# Patient Record
Sex: Female | Born: 1950 | Race: White | Hispanic: No | Marital: Married | Smoking: Never smoker
Health system: Southern US, Community
[De-identification: ages and names within clinical notes are randomized; demographics above are authoritative.]

## PROBLEM LIST (undated history)

## (undated) DIAGNOSIS — L719 Rosacea, unspecified: Secondary | ICD-10-CM

## (undated) DIAGNOSIS — G608 Other hereditary and idiopathic neuropathies: Secondary | ICD-10-CM

## (undated) DIAGNOSIS — K219 Gastro-esophageal reflux disease without esophagitis: Secondary | ICD-10-CM

## (undated) DIAGNOSIS — R112 Nausea with vomiting, unspecified: Secondary | ICD-10-CM

## (undated) DIAGNOSIS — C189 Malignant neoplasm of colon, unspecified: Secondary | ICD-10-CM

## (undated) DIAGNOSIS — Z860100 Personal history of colon polyps, unspecified: Secondary | ICD-10-CM

## (undated) DIAGNOSIS — G709 Myoneural disorder, unspecified: Secondary | ICD-10-CM

## (undated) DIAGNOSIS — Z9889 Other specified postprocedural states: Secondary | ICD-10-CM

## (undated) DIAGNOSIS — M8580 Other specified disorders of bone density and structure, unspecified site: Secondary | ICD-10-CM

## (undated) DIAGNOSIS — I639 Cerebral infarction, unspecified: Secondary | ICD-10-CM

## (undated) DIAGNOSIS — Z8719 Personal history of other diseases of the digestive system: Secondary | ICD-10-CM

## (undated) DIAGNOSIS — M1990 Unspecified osteoarthritis, unspecified site: Secondary | ICD-10-CM

## (undated) DIAGNOSIS — C801 Malignant (primary) neoplasm, unspecified: Secondary | ICD-10-CM

## (undated) DIAGNOSIS — K529 Noninfective gastroenteritis and colitis, unspecified: Secondary | ICD-10-CM

## (undated) DIAGNOSIS — M8589 Other specified disorders of bone density and structure, multiple sites: Secondary | ICD-10-CM

## (undated) DIAGNOSIS — E785 Hyperlipidemia, unspecified: Secondary | ICD-10-CM

## (undated) DIAGNOSIS — R202 Paresthesia of skin: Secondary | ICD-10-CM

## (undated) DIAGNOSIS — R200 Anesthesia of skin: Secondary | ICD-10-CM

## (undated) DIAGNOSIS — I10 Essential (primary) hypertension: Secondary | ICD-10-CM

## (undated) HISTORY — PX: CHOLECYSTECTOMY: SHX55

## (undated) HISTORY — PX: EYE SURGERY: SHX253

## (undated) HISTORY — PX: ABDOMINAL HYSTERECTOMY: SHX81

## (undated) HISTORY — PX: TONSILLECTOMY: SUR1361

## (undated) HISTORY — PX: COLON SURGERY: SHX602

---

## 2010-12-04 HISTORY — PX: COLON SURGERY: SHX602

## 2017-02-26 ENCOUNTER — Other Ambulatory Visit: Payer: Self-pay | Admitting: Family Medicine

## 2017-02-26 DIAGNOSIS — Z1231 Encounter for screening mammogram for malignant neoplasm of breast: Secondary | ICD-10-CM

## 2017-03-26 ENCOUNTER — Ambulatory Visit
Admission: RE | Admit: 2017-03-26 | Discharge: 2017-03-26 | Disposition: A | Discharge status: Home / Self Care | Payer: Medicare HMO | Source: Ambulatory Visit | Attending: Family Medicine | Admitting: Family Medicine

## 2017-03-26 ENCOUNTER — Encounter: Payer: Self-pay | Admitting: Radiology

## 2017-03-26 DIAGNOSIS — Z1231 Encounter for screening mammogram for malignant neoplasm of breast: Secondary | ICD-10-CM | POA: Diagnosis not present

## 2017-10-29 ENCOUNTER — Encounter: Payer: Self-pay | Admitting: *Deleted

## 2017-10-30 ENCOUNTER — Encounter
Admission: RE | Disposition: A | Discharge status: Home / Self Care | Payer: Self-pay | Source: Ambulatory Visit | Attending: Gastroenterology

## 2017-10-30 ENCOUNTER — Ambulatory Visit: Payer: Medicare HMO | Admitting: Anesthesiology

## 2017-10-30 ENCOUNTER — Encounter: Payer: Self-pay | Admitting: Anesthesiology

## 2017-10-30 ENCOUNTER — Ambulatory Visit
Admission: RE | Admit: 2017-10-30 | Discharge: 2017-10-30 | Disposition: A | Discharge status: Home / Self Care | Payer: Medicare HMO | Source: Ambulatory Visit | Attending: Gastroenterology | Admitting: Gastroenterology

## 2017-10-30 DIAGNOSIS — Z85038 Personal history of other malignant neoplasm of large intestine: Secondary | ICD-10-CM | POA: Diagnosis present

## 2017-10-30 DIAGNOSIS — E119 Type 2 diabetes mellitus without complications: Secondary | ICD-10-CM | POA: Insufficient documentation

## 2017-10-30 DIAGNOSIS — L719 Rosacea, unspecified: Secondary | ICD-10-CM | POA: Insufficient documentation

## 2017-10-30 DIAGNOSIS — K2960 Other gastritis without bleeding: Secondary | ICD-10-CM | POA: Insufficient documentation

## 2017-10-30 DIAGNOSIS — Z8673 Personal history of transient ischemic attack (TIA), and cerebral infarction without residual deficits: Secondary | ICD-10-CM | POA: Diagnosis not present

## 2017-10-30 DIAGNOSIS — Z980 Intestinal bypass and anastomosis status: Secondary | ICD-10-CM | POA: Insufficient documentation

## 2017-10-30 DIAGNOSIS — K449 Diaphragmatic hernia without obstruction or gangrene: Secondary | ICD-10-CM | POA: Insufficient documentation

## 2017-10-30 DIAGNOSIS — I10 Essential (primary) hypertension: Secondary | ICD-10-CM | POA: Insufficient documentation

## 2017-10-30 DIAGNOSIS — K210 Gastro-esophageal reflux disease with esophagitis: Secondary | ICD-10-CM | POA: Diagnosis not present

## 2017-10-30 DIAGNOSIS — Z86010 Personal history of colonic polyps: Secondary | ICD-10-CM | POA: Diagnosis not present

## 2017-10-30 DIAGNOSIS — Z79899 Other long term (current) drug therapy: Secondary | ICD-10-CM | POA: Insufficient documentation

## 2017-10-30 DIAGNOSIS — K52832 Lymphocytic colitis: Secondary | ICD-10-CM | POA: Diagnosis not present

## 2017-10-30 DIAGNOSIS — M8580 Other specified disorders of bone density and structure, unspecified site: Secondary | ICD-10-CM | POA: Diagnosis not present

## 2017-10-30 DIAGNOSIS — E785 Hyperlipidemia, unspecified: Secondary | ICD-10-CM | POA: Diagnosis not present

## 2017-10-30 DIAGNOSIS — K5730 Diverticulosis of large intestine without perforation or abscess without bleeding: Secondary | ICD-10-CM | POA: Insufficient documentation

## 2017-10-30 DIAGNOSIS — K317 Polyp of stomach and duodenum: Secondary | ICD-10-CM | POA: Diagnosis not present

## 2017-10-30 HISTORY — DX: Malignant (primary) neoplasm, unspecified: C80.1

## 2017-10-30 HISTORY — DX: Other specified disorders of bone density and structure, unspecified site: M85.80

## 2017-10-30 HISTORY — DX: Rosacea, unspecified: L71.9

## 2017-10-30 HISTORY — DX: Cerebral infarction, unspecified: I63.9

## 2017-10-30 HISTORY — DX: Hyperlipidemia, unspecified: E78.5

## 2017-10-30 HISTORY — DX: Essential (primary) hypertension: I10

## 2017-10-30 HISTORY — PX: COLONOSCOPY WITH PROPOFOL: SHX5780

## 2017-10-30 HISTORY — PX: ESOPHAGOGASTRODUODENOSCOPY (EGD) WITH PROPOFOL: SHX5813

## 2017-10-30 HISTORY — DX: Gastro-esophageal reflux disease without esophagitis: K21.9

## 2017-10-30 SURGERY — ESOPHAGOGASTRODUODENOSCOPY (EGD) WITH PROPOFOL
Anesthesia: General

## 2017-10-30 MED ORDER — MIDAZOLAM HCL 2 MG/2ML IJ SOLN
INTRAMUSCULAR | Status: DC | PRN
  Administered 2017-10-30: 2 mg via INTRAVENOUS

## 2017-10-30 MED ORDER — EPHEDRINE SULFATE 50 MG/ML IJ SOLN
INTRAMUSCULAR | Status: DC | PRN
  Administered 2017-10-30: 10 mg via INTRAVENOUS

## 2017-10-30 MED ORDER — FENTANYL CITRATE (PF) 100 MCG/2ML IJ SOLN
INTRAMUSCULAR | Status: AC
  Filled 2017-10-30: qty 2

## 2017-10-30 MED ORDER — FENTANYL CITRATE (PF) 100 MCG/2ML IJ SOLN
INTRAMUSCULAR | Status: DC | PRN
  Administered 2017-10-30: 50 ug via INTRAVENOUS

## 2017-10-30 MED ORDER — PROPOFOL 500 MG/50ML IV EMUL
INTRAVENOUS | Status: AC
  Filled 2017-10-30: qty 50

## 2017-10-30 MED ORDER — SODIUM CHLORIDE 0.9 % IV SOLN
INTRAVENOUS | Status: DC
  Administered 2017-10-30: 1000 mL via INTRAVENOUS

## 2017-10-30 MED ORDER — LIDOCAINE HCL (PF) 1 % IJ SOLN
2.0000 mL | Freq: Once | INTRAMUSCULAR | Status: AC
  Administered 2017-10-30: 0.3 mL via INTRADERMAL

## 2017-10-30 MED ORDER — LIDOCAINE HCL (PF) 1 % IJ SOLN
INTRAMUSCULAR | Status: AC
  Administered 2017-10-30: 0.3 mL via INTRADERMAL
  Filled 2017-10-30: qty 2

## 2017-10-30 MED ORDER — PHENYLEPHRINE HCL 10 MG/ML IJ SOLN
INTRAMUSCULAR | Status: DC | PRN
  Administered 2017-10-30 (×4): 100 ug via INTRAVENOUS
  Administered 2017-10-30: 50 ug via INTRAVENOUS
  Administered 2017-10-30: 100 ug via INTRAVENOUS
  Administered 2017-10-30: 50 ug via INTRAVENOUS

## 2017-10-30 MED ORDER — EPHEDRINE SULFATE 50 MG/ML IJ SOLN
INTRAMUSCULAR | Status: AC
Start: 2017-10-30 — End: ?
  Filled 2017-10-30: qty 1

## 2017-10-30 MED ORDER — LIDOCAINE HCL (PF) 2 % IJ SOLN
INTRAMUSCULAR | Status: AC
  Filled 2017-10-30: qty 10

## 2017-10-30 MED ORDER — SODIUM CHLORIDE 0.9 % IV SOLN
INTRAVENOUS | Status: DC
  Administered 2017-10-30: 13:00:00 via INTRAVENOUS

## 2017-10-30 MED ORDER — LIDOCAINE HCL (CARDIAC) 20 MG/ML IV SOLN
INTRAVENOUS | Status: DC | PRN
  Administered 2017-10-30: 50 mg via INTRAVENOUS

## 2017-10-30 MED ORDER — PROPOFOL 500 MG/50ML IV EMUL
INTRAVENOUS | Status: DC | PRN
  Administered 2017-10-30: 120 ug/kg/min via INTRAVENOUS

## 2017-10-30 MED ORDER — MIDAZOLAM HCL 2 MG/2ML IJ SOLN
INTRAMUSCULAR | Status: AC
  Filled 2017-10-30: qty 2

## 2017-10-30 NOTE — Op Note (Signed)
[facility] Gastroenterology Patient Name: Brittany Franklin Procedure Date: 10/30/2017 1:01 PM MRN: [id number] Account #: [id number] Date of Birth: 1951/07/14 Admit Type: Outpatient Age: 66 Room: [facility] ENDO ROOM 3 Gender: Female Note Status: Finalized Procedure:            Colonoscopy Indications:          Personal history of malignant neoplasm of the colon,                        Personal history of colonic polyps Providers:            Lollie Sails, MD Referring MD:         Dion Body (Referring MD) Medicines:            Monitored Anesthesia Care Complications:        No immediate complications. Procedure:            Pre-Anesthesia Assessment:                       - ASA Grade Assessment: II - A patient with mild                        systemic disease.                       After obtaining informed consent, the colonoscope was                        passed under direct vision. Throughout the procedure,                        the patient's blood pressure, pulse, and oxygen                        saturations were monitored continuously. The                        Colonoscope was introduced through the anus and                        advanced to the the ileocolonic anastomosis. The                        colonoscopy was performed with moderate difficulty due                        to restricted mobility of the colon, significant                        looping and a tortuous colon. Successful completion of                        the procedure was aided by changing the patient to a                        supine position, changing the patient to a prone                        position and using manual pressure. The patient  tolerated the procedure well. The quality of the bowel                        preparation was good. Findings:      There was evidence of a prior end-to-side ileo-colonic anastomosis in       the mid ascending  colon. This was patent and was characterized by       healthy appearing mucosa, but with a small polypoid lesion at the       anastomosis that was friable to touch. This was removed completely with       a cold forcep.      The sigmoid colon, descending colon and transverse colon were       significantly redundant.      Multiple medium-mouthed diverticula were found in the sigmoid colon and       descending colon.      The digital rectal exam was normal.      The retroflexed view of the distal rectum and anal verge was normal and       showed no anal or rectal abnormalities.      Biopsies for histology were taken with a cold forceps from the right       colon and left colon for evaluation of microscopic colitis. Impression:           - Patent end-to-side ileo-colonic anastomosis,                        characterized by healthy appearing mucosa. One small                        polypoid lesion at the anastomosis, removed.                       - Redundant colon.                       - Diverticulosis in the sigmoid colon and in the                        descending colon.                       - The distal rectum and anal verge are normal on                        retroflexion view. Recommendation:       - Discharge patient to home.                       - Await pathology results.                       - Telephone GI clinic for pathology results in 1 week. Procedure Code(s):    --- Professional ---                       [phone], Colonoscopy, flexible; with biopsy, single or                        multiple Diagnosis Code(s):    --- Professional ---  Z98.0, Intestinal bypass and anastomosis status                       Z85.038, Personal history of other malignant neoplasm                        of large intestine                       Z86.010, Personal history of colonic polyps                       K57.30, Diverticulosis of large intestine without                         perforation or abscess without bleeding                       Q43.8, Other specified congenital malformations of                        intestine CPT copyright 2016 American Medical Association. All rights reserved. The codes documented in this report are preliminary and upon coder review may  be revised to meet current compliance requirements. Lollie Sails, MD 10/30/2017 2:36:33 PM This report has been signed electronically. Number of Addenda: 0 Note Initiated On: 10/30/2017 1:01 PM Scope Withdrawal Time: 0 hours 14 minutes 45 seconds  Total Procedure Duration: 0 hours 38 minutes 42 seconds       [facility]

## 2017-10-30 NOTE — Op Note (Signed)
[facility] Gastroenterology Patient Name: Brittany Franklin Procedure Date: 10/30/2017 1:01 PM MRN: [id number] Account #: [id number] Date of Birth: 06-20-1951 Admit Type: Outpatient Age: 66 Room: [facility] ENDO ROOM 3 Gender: Female Note Status: Finalized Procedure:            Upper GI endoscopy Indications:          Gastro-esophageal reflux disease Providers:            Lollie Sails, MD Referring MD:         Dion Body (Referring MD) Medicines:            Monitored Anesthesia Care Complications:        No immediate complications. Procedure:            Pre-Anesthesia Assessment:                       - ASA Grade Assessment: II - A patient with mild                        systemic disease.                       After obtaining informed consent, the endoscope was                        passed under direct vision. Throughout the procedure,                        the patient's blood pressure, pulse, and oxygen                        saturations were monitored continuously. The Endoscope                        was introduced through the mouth, and advanced to the                        duodenal bulb. The upper GI endoscopy was accomplished                        without difficulty. The patient tolerated the procedure                        well. Findings:      LA Grade A (one or more mucosal breaks less than 5 mm, not extending       between tops of 2 mucosal folds) esophagitis with no bleeding was found.       Biopsies were taken with a cold forceps for histology.      A small hiatal hernia was present.      The exam of the esophagus was otherwise normal.      Diffuse and patchy mild inflammation characterized by adherent blood,       congestion (edema), erythema and granularity was found in the gastric       body and in the gastric antrum. Biopsies were taken with a cold forceps       for histology. Biopsies were taken with a cold forceps for Helicobacter        pylori testing.      The cardia and gastric fundus were normal on retroflexion otherwise.  The duodenal bulb was normal. Despite several efforts I was unable to       pass into the second portion of the duodenum due to a sharp angulation.      A single 5 mm sessile polyp with no bleeding and no stigmata of recent       bleeding was found on the greater curvature of the gastric body. This       was atypical in appearance with a small non-bleeding erosion in the       center. Biopsies were taken and placed into a separate jar with a cold       forceps for histology.      Multiple 1 to 3 mm sessile polyps with no bleeding and no stigmata of       recent bleeding were found in the gastric body. Biopsies were taken with       a cold forceps for histology. Impression:           - LA Grade A reflux esophagitis. Biopsied.                       - Small hiatal hernia.                       - Erosive gastritis. Biopsied.                       - Normal duodenal bulb. Recommendation:       - Await pathology results.                       - Use Protonix (pantoprazole) 40 mg PO daily daily.                       - Await pathology results.                       - Return to GI clinic in 4 weeks. Procedure Code(s):    --- Professional ---                       [phone], Esophagogastroduodenoscopy, flexible, transoral;                        with biopsy, single or multiple Diagnosis Code(s):    --- Professional ---                       K21.0, Gastro-esophageal reflux disease with esophagitis                       K44.9, Diaphragmatic hernia without obstruction or                        gangrene                       K29.60, Other gastritis without bleeding CPT copyright 2016 American Medical Association. All rights reserved. The codes documented in this report are preliminary and upon coder review may  be revised to meet current compliance requirements. Lollie Sails, MD 10/30/2017 1:46:08  PM This report has been signed electronically. Number of Addenda: 0 Note Initiated On: 10/30/2017 1:01 PM      [facility]

## 2017-10-30 NOTE — Transfer of Care (Signed)
Immediate Anesthesia Transfer of Care Note  Patient: Brittany Franklin  Procedure(s) Performed: ESOPHAGOGASTRODUODENOSCOPY (EGD) WITH PROPOFOL (N/A ) COLONOSCOPY WITH PROPOFOL (N/A )  Patient Location: PACU  Anesthesia Type:General  Level of Consciousness: awake and sedated  Airway & Oxygen Therapy: Patient Spontanous Breathing and Patient connected to nasal cannula oxygen  Post-op Assessment: Report given to RN and Post -op Vital signs reviewed and stable  Post vital signs: Reviewed and stable  Last Vitals:  Vitals:   10/30/17 1231  BP: (!) 146/73  Pulse: 90  Resp: 20  Temp: (!) 35.7 C  SpO2: 100%    Last Pain:  Vitals:   10/30/17 1231  TempSrc: Tympanic         Complications: No apparent anesthesia complications

## 2017-10-30 NOTE — Anesthesia Procedure Notes (Signed)
Performed by: Cook-Martin, Jozi Malachi Pre-anesthesia Checklist: Patient identified, Emergency Drugs available, Suction available, Patient being monitored and Timeout performed Patient Re-evaluated:Patient Re-evaluated prior to induction Oxygen Delivery Method: Nasal cannula Preoxygenation: Pre-oxygenation with 100% oxygen Induction Type: IV induction Airway Equipment and Method: Bite block Placement Confirmation: positive ETCO2 and CO2 detector       

## 2017-10-30 NOTE — H&P (Signed)
Outpatient short stay form Pre-procedure 10/30/2017 12:26 PM Brittany Sails MD  Primary Physician: Dr Dion Body  Reason for visit:  EGD and colonoscopy  History of present illness:  Patient is a 66 year old female presenting today as above. 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 She subsequently underwent a sigmoid colectomy. There were negativeand she required no adjuvant treatment. She also has a history of GERD and uses antiacids at least 3 times a week. She is currently not on a proton pump inhibitor. She tolerated her prep well. She takes no aspirin or blood thinning agents.    Current Facility-Administered Medications:  .  0.9 %  sodium chloride infusion, , Intravenous, Continuous, Brittany Sails, MD  Medications Prior to Admission  Medication Sig Dispense Refill Last Dose  . amLODipine (NORVASC) 5 MG tablet Take 5 mg by mouth daily.     . Azelaic Acid (FINACEA) 15 % cream Apply topically daily. After skin is thoroughly washed and patted dry, gently but thoroughly massage a thin film of azelaic acid cream into the affected area twice daily, in the morning and evening.     . cetirizine (ZYRTEC) 10 MG tablet Take 10 mg by mouth daily.     Marland Kitchen co-enzyme Q-10 50 MG capsule Take 200 mg by mouth daily.     Marland Kitchen esomeprazole (NEXIUM) 20 MG capsule Take 20 mg by mouth daily at 12 noon.     . metroNIDAZOLE (METROCREAM) 0.75 % cream Apply topically 2 (two) times daily.     . Multiple Vitamin (MULTIVITAMIN) tablet Take 1 tablet by mouth daily.     . potassium bicarbonate (K-LYTE) 25 MEQ disintegrating tablet Take 25 mEq by mouth 2 (two) times daily.     . Red Yeast Rice Extract (RED YEAST RICE PO) Take by mouth daily.        Not on File   Past Medical History:  Diagnosis Date  . Cancer (HCC)    COLON CANCER  . Diabetes mellitus without complication ([place])   . GERD (gastroesophageal reflux disease)   .  Hyperlipidemia   . Hypertension   . Osteopenia   . Rosacea   . Stroke [facility])     Review of systems:      Physical Exam    Heart and lungs: Regular rate and rhythm without rub or gallop, lungs are bilaterally clear.    HEENT: Normocephalic atraumatic eyes are anicteric    Other:     Pertinant exam for procedure: Soft nontender nondistended bowel sounds positive normoactive.    Planned proceedures: EGD, colonoscopy and indicated procedures. I have discussed the risks benefits and complications of procedures to include not limited to bleeding, infection, perforation and the risk of sedation and the patient wishes to proceed.    Brittany Sails, MD Gastroenterology 10/30/2017  12:26 PM

## 2017-10-30 NOTE — Anesthesia Preprocedure Evaluation (Signed)
Anesthesia Evaluation  Patient identified by MRN, date of birth, ID band  Airway Mallampati: II       Dental  (+) Teeth Intact   Pulmonary neg pulmonary ROS,    breath sounds clear to auscultation       Cardiovascular Exercise Tolerance: Good hypertension, Pt. on medications  Rhythm:Regular Rate:Normal     Neuro/Psych CVA negative neurological ROS     GI/Hepatic Neg liver ROS, GERD  Medicated,  Endo/Other  diabetes  Renal/GU negative Renal ROS     Musculoskeletal   Abdominal Normal abdominal exam  (+)   Peds negative pediatric ROS (+)  Hematology negative hematology ROS (+)   Anesthesia Other Findings   Reproductive/Obstetrics                             Anesthesia Physical Anesthesia Plan  ASA: II  Anesthesia Plan: General   Post-op Pain Management:    Induction: Intravenous  PONV Risk Score and Plan: 1 and Ondansetron  Airway Management Planned: Natural Airway and Nasal Cannula  Additional Equipment:   Intra-op Plan:   Post-operative Plan:   Informed Consent: I have reviewed the patients History and Physical, chart, labs and discussed the procedure including the risks, benefits and alternatives for the proposed anesthesia with the patient or authorized representative who has indicated his/her understanding and acceptance.     Plan Discussed with: CRNA  Anesthesia Plan Comments:         Anesthesia Quick Evaluation

## 2017-10-30 NOTE — Anesthesia Post-op Follow-up Note (Signed)
Anesthesia QCDR form completed.        

## 2017-10-31 ENCOUNTER — Encounter: Payer: Self-pay | Admitting: Gastroenterology

## 2017-10-31 NOTE — Anesthesia Postprocedure Evaluation (Signed)
Anesthesia Post Note  Patient: Brittany Franklin  Procedure(s) Performed: ESOPHAGOGASTRODUODENOSCOPY (EGD) WITH PROPOFOL (N/A ) COLONOSCOPY WITH PROPOFOL (N/A )  Patient location during evaluation: PACU Anesthesia Type: General Level of consciousness: awake Pain management: pain level controlled Vital Signs Assessment: post-procedure vital signs reviewed and stable Respiratory status: spontaneous breathing Cardiovascular status: stable Anesthetic complications: no     Last Vitals:  Vitals:   10/30/17 1443 10/30/17 1453  BP: 112/60 (!) 127/59  Pulse: (!) 112 60  Resp: 14 18  Temp:    SpO2: 97% (!) 83%    Last Pain:  Vitals:   10/30/17 1433  TempSrc: Tympanic                 VAN STAVEREN,Christionna Poland

## 2017-11-02 LAB — SURGICAL PATHOLOGY

## 2017-12-13 ENCOUNTER — Other Ambulatory Visit
Admission: RE | Admit: 2017-12-13 | Discharge: 2017-12-13 | Disposition: A | Discharge status: Home / Self Care | Payer: Medicare HMO | Source: Ambulatory Visit | Attending: Gastroenterology | Admitting: Gastroenterology

## 2017-12-13 DIAGNOSIS — R197 Diarrhea, unspecified: Secondary | ICD-10-CM | POA: Diagnosis present

## 2017-12-13 DIAGNOSIS — R1084 Generalized abdominal pain: Secondary | ICD-10-CM | POA: Insufficient documentation

## 2018-01-09 LAB — MISCELLANEOUS TEST

## 2018-03-07 ENCOUNTER — Other Ambulatory Visit: Payer: Self-pay | Admitting: Family Medicine

## 2018-03-07 DIAGNOSIS — Z1231 Encounter for screening mammogram for malignant neoplasm of breast: Secondary | ICD-10-CM

## 2018-03-28 ENCOUNTER — Ambulatory Visit
Admission: RE | Admit: 2018-03-28 | Discharge: 2018-03-28 | Disposition: A | Discharge status: Home / Self Care | Payer: Medicare HMO | Source: Ambulatory Visit | Attending: Family Medicine | Admitting: Family Medicine

## 2018-03-28 DIAGNOSIS — Z1231 Encounter for screening mammogram for malignant neoplasm of breast: Secondary | ICD-10-CM | POA: Insufficient documentation

## 2018-03-28 HISTORY — DX: Malignant neoplasm of colon, unspecified: C18.9

## 2018-11-30 IMAGING — MG MM DIGITAL SCREENING BILAT W/ TOMO W/ CAD
8 of 12 series · 8 of 28 positions shown · non-contrast
Comparison: None available.

CLINICAL DATA: Screening.

EXAM:
2D DIGITAL SCREENING BILATERAL MAMMOGRAM WITH CAD AND ADJUNCT TOMO

[R MLO synth-2D]
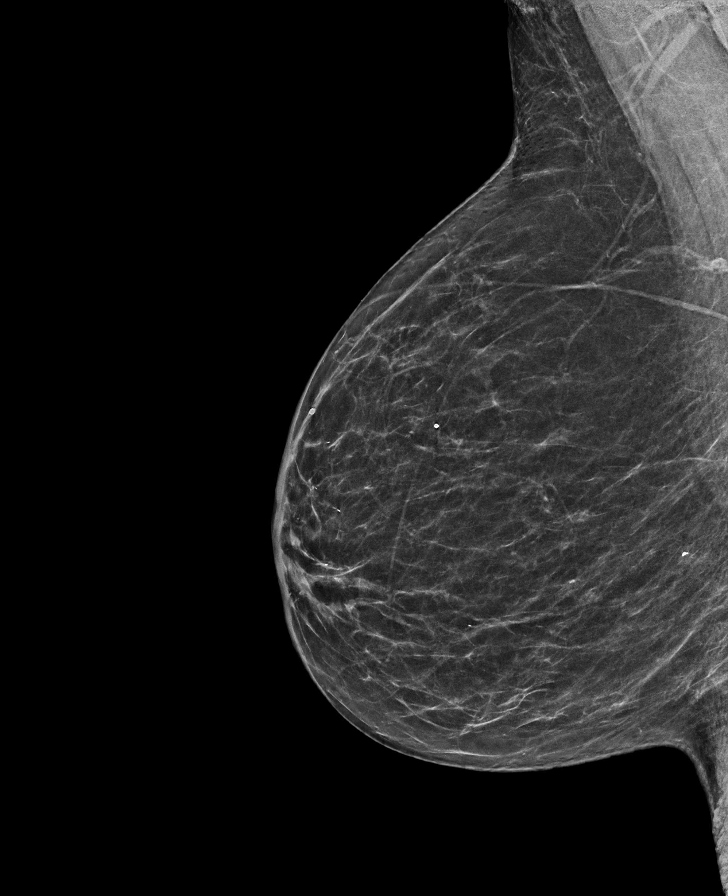

[L MLO synth-2D]
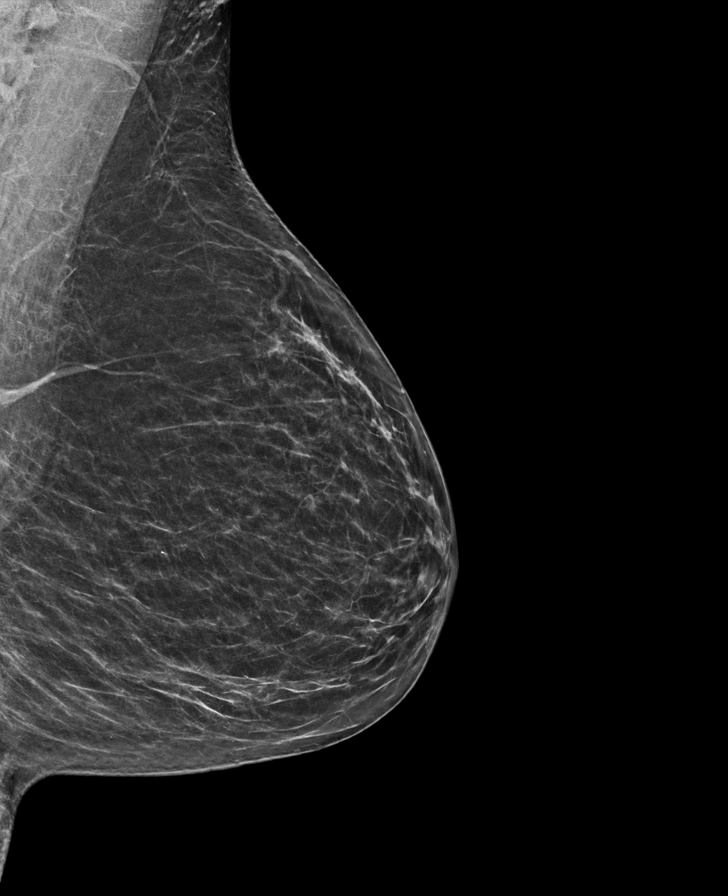

[R CC]
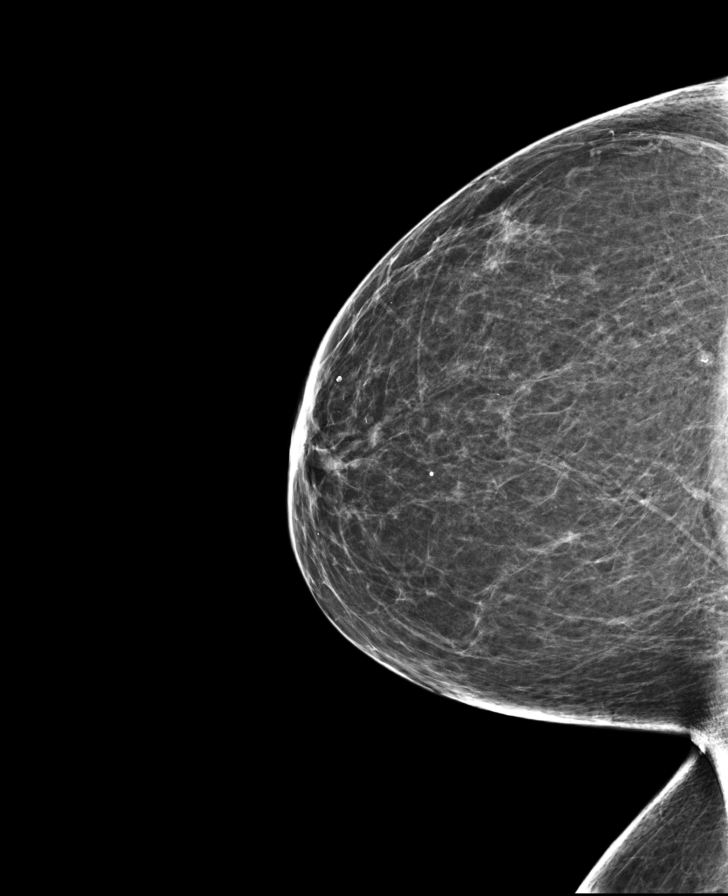

[L CC]
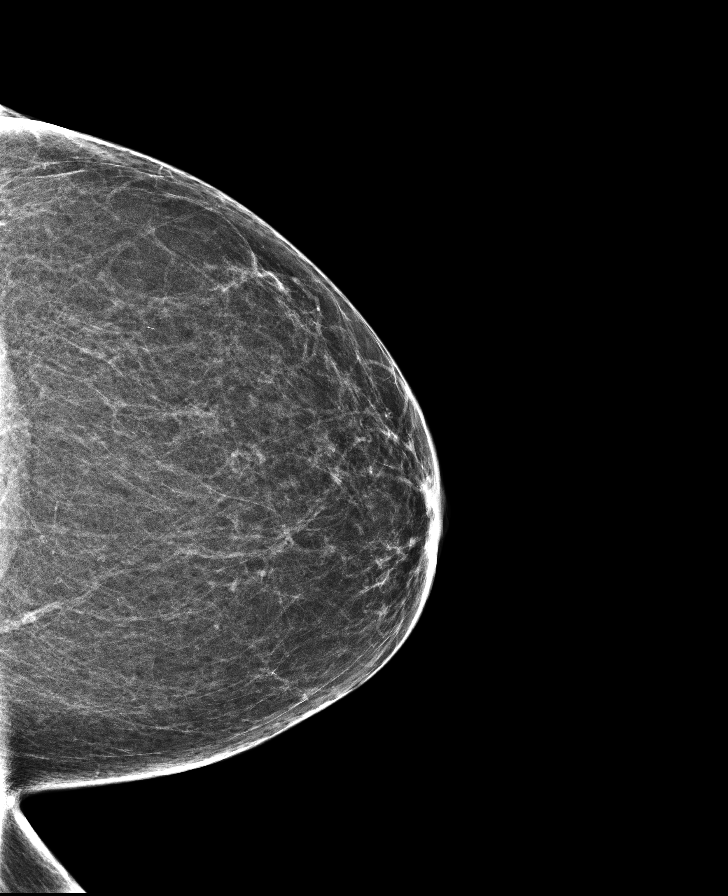

[L CC synth-2D]
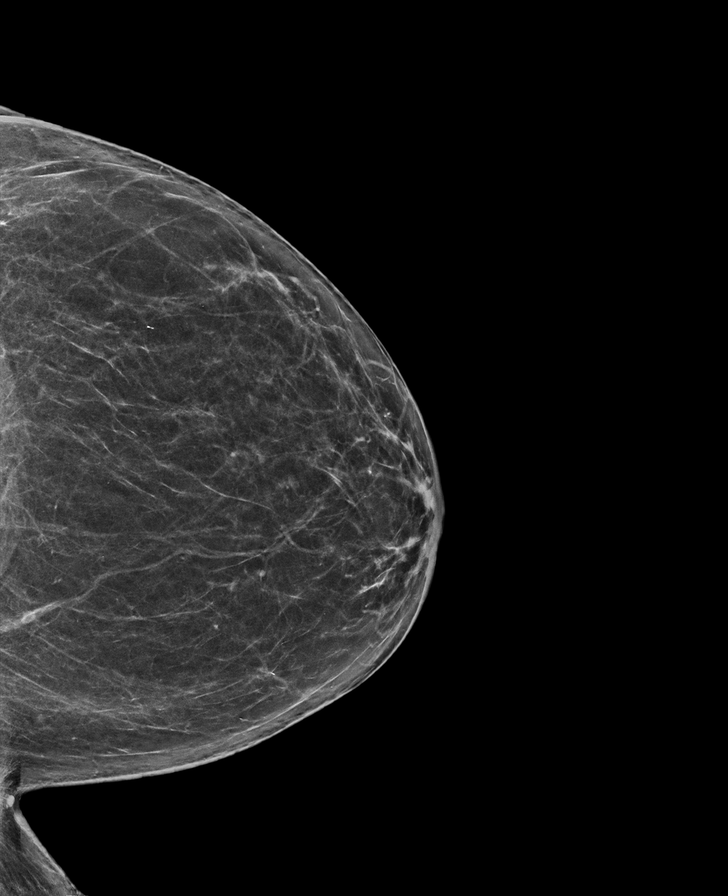

[R CC synth-2D]
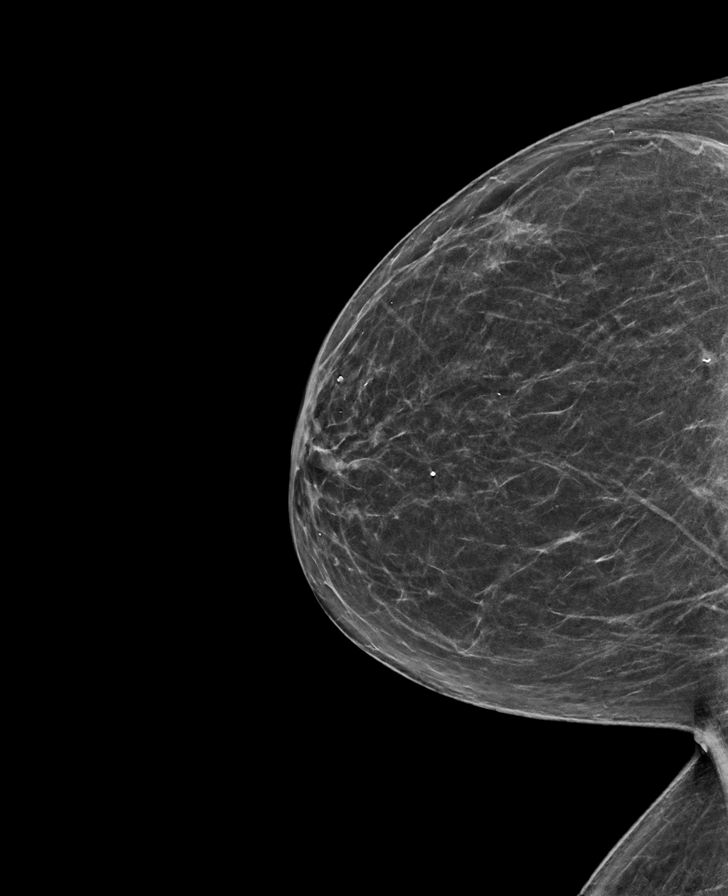

[R MLO]
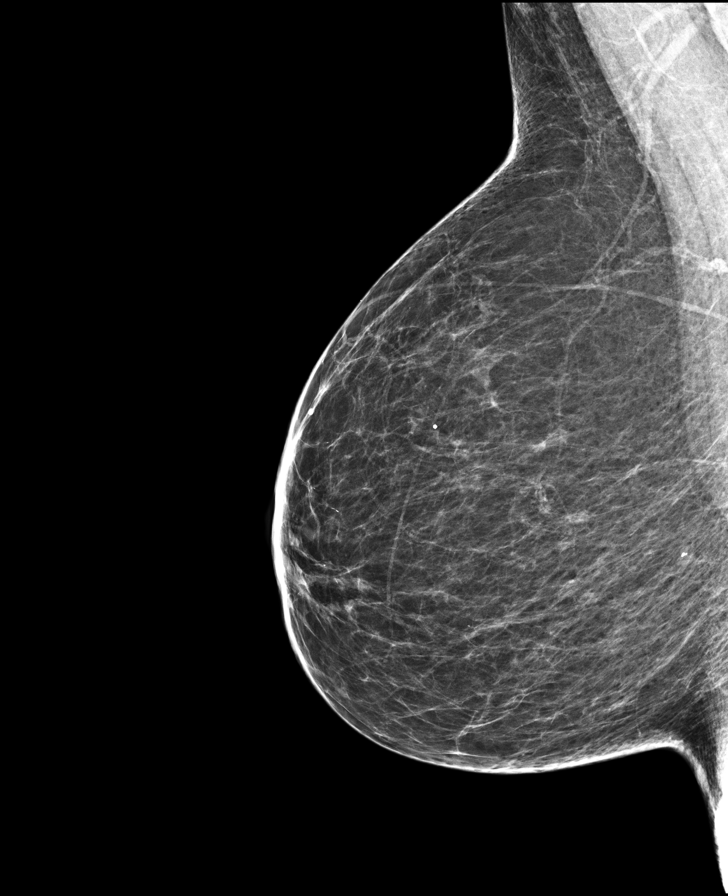

[L MLO]
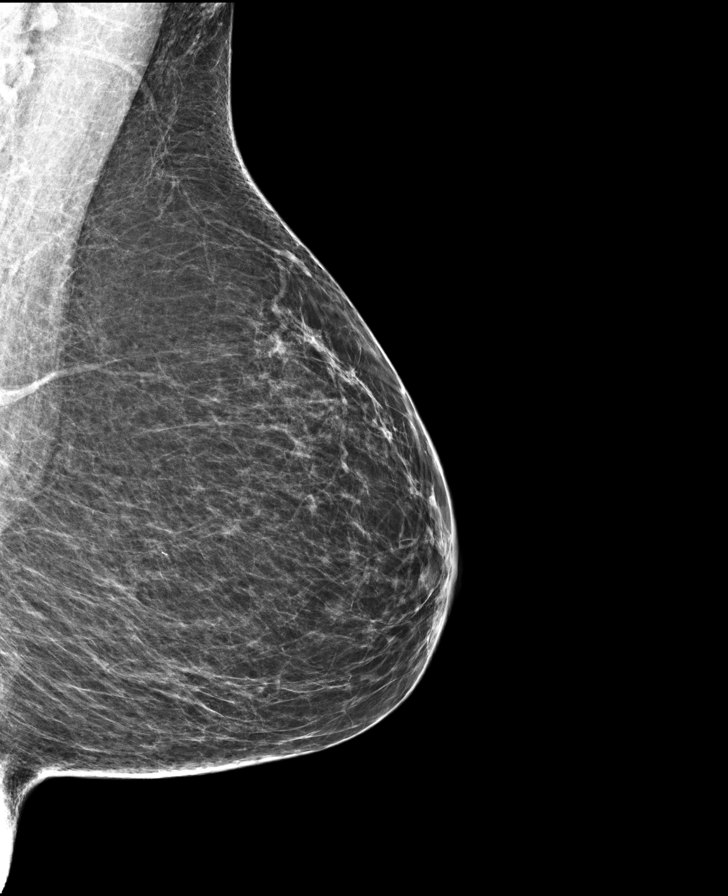

[8 of 28 positions shown; findings below may reference images not displayed]

Patient's prior mammograms were never
received.

ACR Breast Density Category b: There are scattered areas of
fibroglandular density.
FINDINGS: There are no findings suspicious for malignancy. Images were
processed with CAD.
IMPRESSION: No mammographic evidence of malignancy. A result letter of this
screening mammogram will be mailed directly to the patient.

RECOMMENDATION:
Screening mammogram in one year. (Code:GA-B-KTC)

BI-RADS CATEGORY  1: Negative.

## 2019-05-12 ENCOUNTER — Other Ambulatory Visit: Payer: Self-pay | Admitting: Family Medicine

## 2019-05-12 DIAGNOSIS — Z1231 Encounter for screening mammogram for malignant neoplasm of breast: Secondary | ICD-10-CM

## 2019-06-20 ENCOUNTER — Ambulatory Visit
Admission: RE | Admit: 2019-06-20 | Discharge: 2019-06-20 | Disposition: A | Discharge status: Home / Self Care | Payer: Medicare HMO | Source: Ambulatory Visit | Attending: Family Medicine | Admitting: Family Medicine

## 2019-06-20 ENCOUNTER — Other Ambulatory Visit: Payer: Self-pay

## 2019-06-20 DIAGNOSIS — Z1231 Encounter for screening mammogram for malignant neoplasm of breast: Secondary | ICD-10-CM | POA: Diagnosis present

## 2020-05-12 ENCOUNTER — Other Ambulatory Visit: Payer: Self-pay | Admitting: Family Medicine

## 2020-05-12 DIAGNOSIS — Z1231 Encounter for screening mammogram for malignant neoplasm of breast: Secondary | ICD-10-CM

## 2020-06-25 ENCOUNTER — Ambulatory Visit
Admission: RE | Admit: 2020-06-25 | Discharge: 2020-06-25 | Disposition: A | Discharge status: Home / Self Care | Payer: Medicare HMO | Source: Ambulatory Visit | Attending: Family Medicine | Admitting: Family Medicine

## 2020-06-25 DIAGNOSIS — Z1231 Encounter for screening mammogram for malignant neoplasm of breast: Secondary | ICD-10-CM | POA: Diagnosis present

## 2020-09-30 ENCOUNTER — Other Ambulatory Visit: Payer: Medicare HMO | Attending: Gastroenterology

## 2020-10-13 ENCOUNTER — Other Ambulatory Visit: Payer: Self-pay

## 2020-10-13 ENCOUNTER — Other Ambulatory Visit
Admission: RE | Admit: 2020-10-13 | Discharge: 2020-10-13 | Disposition: A | Discharge status: Home / Self Care | Payer: Medicare HMO | Source: Ambulatory Visit | Attending: Gastroenterology | Admitting: Gastroenterology

## 2020-10-13 DIAGNOSIS — Z20822 Contact with and (suspected) exposure to covid-19: Secondary | ICD-10-CM | POA: Insufficient documentation

## 2020-10-13 DIAGNOSIS — Z01818 Encounter for other preprocedural examination: Secondary | ICD-10-CM | POA: Insufficient documentation

## 2020-10-14 LAB — SARS CORONAVIRUS 2 (TAT 6-24 HRS): SARS Coronavirus 2: NEGATIVE

## 2020-10-15 ENCOUNTER — Ambulatory Visit
Admission: RE | Admit: 2020-10-15 | Discharge: 2020-10-15 | Disposition: A | Discharge status: Home / Self Care | Payer: Medicare HMO | Attending: Gastroenterology | Admitting: Gastroenterology

## 2020-10-15 ENCOUNTER — Encounter: Payer: Self-pay | Admitting: *Deleted

## 2020-10-15 ENCOUNTER — Ambulatory Visit: Payer: Medicare HMO | Admitting: Anesthesiology

## 2020-10-15 ENCOUNTER — Other Ambulatory Visit: Payer: Self-pay

## 2020-10-15 ENCOUNTER — Encounter
Admission: RE | Disposition: A | Discharge status: Home / Self Care | Payer: Self-pay | Source: Home / Self Care | Attending: Gastroenterology

## 2020-10-15 DIAGNOSIS — K640 First degree hemorrhoids: Secondary | ICD-10-CM | POA: Diagnosis not present

## 2020-10-15 DIAGNOSIS — Z79899 Other long term (current) drug therapy: Secondary | ICD-10-CM | POA: Insufficient documentation

## 2020-10-15 DIAGNOSIS — Z85038 Personal history of other malignant neoplasm of large intestine: Secondary | ICD-10-CM | POA: Diagnosis not present

## 2020-10-15 DIAGNOSIS — Z08 Encounter for follow-up examination after completed treatment for malignant neoplasm: Secondary | ICD-10-CM | POA: Insufficient documentation

## 2020-10-15 DIAGNOSIS — Z980 Intestinal bypass and anastomosis status: Secondary | ICD-10-CM | POA: Diagnosis not present

## 2020-10-15 DIAGNOSIS — K5730 Diverticulosis of large intestine without perforation or abscess without bleeding: Secondary | ICD-10-CM | POA: Insufficient documentation

## 2020-10-15 DIAGNOSIS — K52831 Collagenous colitis: Secondary | ICD-10-CM | POA: Insufficient documentation

## 2020-10-15 DIAGNOSIS — Z7983 Long term (current) use of bisphosphonates: Secondary | ICD-10-CM | POA: Insufficient documentation

## 2020-10-15 DIAGNOSIS — Z86010 Personal history of colonic polyps: Secondary | ICD-10-CM | POA: Diagnosis not present

## 2020-10-15 HISTORY — DX: Other specified disorders of bone density and structure, multiple sites: M85.89

## 2020-10-15 HISTORY — DX: Personal history of colonic polyps: Z86.010

## 2020-10-15 HISTORY — PX: COLONOSCOPY WITH PROPOFOL: SHX5780

## 2020-10-15 HISTORY — DX: Personal history of colon polyps, unspecified: Z86.0100

## 2020-10-15 HISTORY — DX: Gastro-esophageal reflux disease without esophagitis: K21.9

## 2020-10-15 SURGERY — COLONOSCOPY WITH PROPOFOL
Anesthesia: General

## 2020-10-15 MED ORDER — LIDOCAINE HCL (CARDIAC) PF 100 MG/5ML IV SOSY
PREFILLED_SYRINGE | INTRAVENOUS | Status: DC | PRN
  Administered 2020-10-15: 100 mg via INTRAVENOUS

## 2020-10-15 MED ORDER — PROPOFOL 10 MG/ML IV BOLUS
INTRAVENOUS | Status: DC | PRN
  Administered 2020-10-15: 50 mg via INTRAVENOUS

## 2020-10-15 MED ORDER — PHENYLEPHRINE HCL (PRESSORS) 10 MG/ML IV SOLN
INTRAVENOUS | Status: DC | PRN
  Administered 2020-10-15: 100 ug via INTRAVENOUS

## 2020-10-15 MED ORDER — SODIUM CHLORIDE 0.9 % IV SOLN: INTRAVENOUS | Status: DC

## 2020-10-15 MED ORDER — PROPOFOL 500 MG/50ML IV EMUL
INTRAVENOUS | Status: DC | PRN
  Administered 2020-10-15: 155 ug/kg/min via INTRAVENOUS

## 2020-10-15 NOTE — Transfer of Care (Signed)
Immediate Anesthesia Transfer of Care Note  Patient: Brittany Franklin  Procedure(s) Performed: COLONOSCOPY WITH PROPOFOL (N/A )  Patient Location: Endoscopy Unit  Anesthesia Type:General  Level of Consciousness: drowsy, patient cooperative and responds to stimulation  Airway & Oxygen Therapy: Patient Spontanous Breathing  Post-op Assessment: Report given to RN and Post -op Vital signs reviewed and stable  Post vital signs: Reviewed and stable  Last Vitals:  Vitals Value Taken Time  BP 121/73 10/15/20 0946  Temp 36.2 C 10/15/20 0945  Pulse 62 10/15/20 0948  Resp 16 10/15/20 0948  SpO2 100 % 10/15/20 0948  Vitals shown include unvalidated device data.  Last Pain:  Vitals:   10/15/20 0945  TempSrc: Temporal  PainSc: Asleep         Complications: No complications documented.

## 2020-10-15 NOTE — Anesthesia Postprocedure Evaluation (Signed)
Anesthesia Post Note  Patient: Brittany Franklin  Procedure(s) Performed: COLONOSCOPY WITH PROPOFOL (N/A )  Patient location during evaluation: Endoscopy Anesthesia Type: General Level of consciousness: awake and alert and oriented Pain management: pain level controlled Vital Signs Assessment: post-procedure vital signs reviewed and stable Respiratory status: spontaneous breathing, nonlabored ventilation and respiratory function stable Cardiovascular status: blood pressure returned to baseline and stable Postop Assessment: no signs of nausea or vomiting Anesthetic complications: no   No complications documented.   Last Vitals:  Vitals:   10/15/20 0906 10/15/20 0945  BP: (!) 165/75 121/73  Pulse: 70   Resp: 18   Temp: (!) 36.4 C (!) 36.2 C  SpO2: 100% 100%    Last Pain:  Vitals:   10/15/20 1005  TempSrc:   PainSc: 0-No pain                 Ayven Pheasant

## 2020-10-15 NOTE — Anesthesia Procedure Notes (Signed)
Procedure Name: General with mask airway Performed by: Fletcher-Harrison, Ermelinda Eckert, CRNA Pre-anesthesia Checklist: Patient identified, Emergency Drugs available and Suction available Patient Re-evaluated:Patient Re-evaluated prior to induction Oxygen Delivery Method: Simple face mask Induction Type: IV induction Placement Confirmation: positive ETCO2 and CO2 detector Dental Injury: Teeth and Oropharynx as per pre-operative assessment        

## 2020-10-15 NOTE — Interval H&P Note (Signed)
History and Physical Interval Note:  10/15/2020 9:17 AM  Brittany Franklin  has presented today for surgery, with the diagnosis of HX OF COLON CANCER.  The various methods of treatment have been discussed with the patient and family. After consideration of risks, benefits and other options for treatment, the patient has consented to  Procedure(s): COLONOSCOPY WITH PROPOFOL (N/A) as a surgical intervention.  The patient's history has been reviewed, patient examined, no change in status, stable for surgery.  I have reviewed the patient's chart and labs.  Questions were answered to the patient's satisfaction.     Lesly Rubenstein  Ok to proceed with colonoscopy

## 2020-10-15 NOTE — H&P (Signed)
Outpatient short stay form Pre-procedure 10/15/2020 9:14 AM Brittany Miyamoto MD, MPH  Primary Physician: Dr. Netty Starring  Reason for visit:  Colon cancer surveillance  History of present illness:   68 y/o lady with history of right sided colon cancer s/p resection here for surveillance. Had surveillance in 2018 with no polyps but lymphocytic colitis noted. Patient with diarrhea that she manages with immodium. No family history of GI malignancies. No blood thinners. History of hysterectomy.    Current Facility-Administered Medications:  .  0.9 %  sodium chloride infusion, , Intravenous, Continuous, Seanpatrick Maisano, Hilton Cork, MD  Medications Prior to Admission  Medication Sig Dispense Refill Last Dose  . alendronate (FOSAMAX) 70 MG tablet Take 70 mg by mouth once a week. Take with a full glass of water on an empty stomach.   Past Week at Unknown time  . azelastine (ASTELIN) 0.1 % nasal spray Place into both nostrils 2 (two) times daily. Use in each nostril as directed     . calcium citrate-vitamin D (CITRACAL+D) 315-200 MG-UNIT tablet Take 2 tablets by mouth.   10/14/2020 at Unknown time  . levocetirizine (XYZAL) 5 MG tablet Take 5 mg by mouth every evening.     Marland Kitchen losartan (COZAAR) 100 MG tablet Take 100 mg by mouth daily.   10/14/2020 at 2230  . Multiple Vitamin (MULTIVITAMIN) tablet Take 1 tablet by mouth daily.   Past Week at Unknown time  . amLODipine (NORVASC) 5 MG tablet Take 5 mg by mouth daily. (Patient not taking: Reported on 10/15/2020)   Not Taking at Unknown time  . Azelaic Acid (FINACEA) 15 % cream Apply topically daily. After skin is thoroughly washed and patted dry, gently but thoroughly massage a thin film of azelaic acid cream into the affected area twice daily, in the morning and evening. (Patient not taking: Reported on 10/15/2020)   Not Taking at Unknown time  . cetirizine (ZYRTEC) 10 MG tablet Take 10 mg by mouth daily.     Marland Kitchen co-enzyme Q-10 50 MG capsule Take 200 mg by mouth  daily.   10/11/20  . esomeprazole (NEXIUM) 20 MG capsule Take 20 mg by mouth daily at 12 noon. (Patient not taking: Reported on 10/15/2020)   Not Taking at Unknown time  . metroNIDAZOLE (METROCREAM) 0.75 % cream Apply topically 2 (two) times daily. (Patient not taking: Reported on 10/15/2020)   Not Taking at Unknown time  . pantoprazole (PROTONIX) 40 MG tablet Take 40 mg by mouth daily. (Patient not taking: Reported on 10/15/2020)   Not Taking at Unknown time  . potassium bicarbonate (K-LYTE) 25 MEQ disintegrating tablet Take 25 mEq by mouth 2 (two) times daily.     . Red Yeast Rice Extract (RED YEAST RICE PO) Take by mouth daily.   11/9/21losartin     No Known Allergies   Past Medical History:  Diagnosis Date  . Cancer (HCC)    COLON CANCER  . Colon cancer ([place])   . GERD (gastroesophageal reflux disease)   . GERD without esophagitis   . History of colon polyps   . Hyperlipidemia   . Hypertension   . Osteopenia   . Osteopenia of multiple sites   . Rosacea     Review of systems:  Otherwise negative.    Physical Exam  Gen: Alert, oriented. Appears stated age.  HEENT: PERRLA. Lungs: No respiratory distress CV: RRR Abd: soft, benign, no masses. Ext: No edema.     Planned procedures: Proceed with colonoscopy. The patient understands the  nature of the planned procedure, indications, risks, alternatives and potential complications including but not limited to bleeding, infection, perforation, damage to internal organs and possible oversedation/side effects from anesthesia. The patient agrees and gives consent to proceed.  Please refer to procedure notes for findings, recommendations and patient disposition/instructions.     Brittany Miyamoto MD, MPH Gastroenterology 10/15/2020  9:14 AM

## 2020-10-15 NOTE — Op Note (Addendum)
[facility] Gastroenterology Patient Name: Brittany Franklin Procedure Date: 10/15/2020 9:17 AM MRN: [id number] Account #: [id number] Date of Birth: November 29, 1951 Admit Type: Outpatient Age: 69 Room: [facility] ENDO ROOM 3 Gender: Female Note Status: Finalized Procedure:             Colonoscopy Indications:           High risk colon cancer surveillance: Personal history                         of colon cancer Providers:             Andrey Farmer MD, MD Referring MD:          Dion Body (Referring MD) Medicines:             Monitored Anesthesia Care Complications:         No immediate complications. Estimated blood loss:                         Minimal. Procedure:             Pre-Anesthesia Assessment:                        - Prior to the procedure, a History and Physical was                         performed, and patient medications and allergies were                         reviewed. The patient is competent. The risks and                         benefits of the procedure and the sedation options and                         risks were discussed with the patient. All questions                         were answered and informed consent was obtained.                         Patient identification and proposed procedure were                         verified by the physician, the nurse, the anesthetist                         and the technician in the endoscopy suite. Mental                         Status Examination: alert and oriented. Airway                         Examination: normal oropharyngeal airway and neck                         mobility. Respiratory Examination: clear to                         auscultation.  CV Examination: normal. Prophylactic                         Antibiotics: The patient does not require prophylactic                         antibiotics. Prior Anticoagulants: The patient has                         taken no previous anticoagulant or  antiplatelet                         agents. ASA Grade Assessment: II - A patient with mild                         systemic disease. After reviewing the risks and                         benefits, the patient was deemed in satisfactory                         condition to undergo the procedure. The anesthesia                         plan was to use monitored anesthesia care (MAC).                         Immediately prior to administration of medications,                         the patient was re-assessed for adequacy to receive                         sedatives. The heart rate, respiratory rate, oxygen                         saturations, blood pressure, adequacy of pulmonary                         ventilation, and response to care were monitored                         throughout the procedure. The physical status of the                         patient was re-assessed after the procedure.                        After obtaining informed consent, the colonoscope was                         passed under direct vision. Throughout the procedure,                         the patient's blood pressure, pulse, and oxygen                         saturations were monitored continuously. The  Colonoscope was introduced through the anus and                         advanced to the the ileocolonic anastomosis. The                         colonoscopy was performed without difficulty. The                         patient tolerated the procedure well. The quality of                         the bowel preparation was good. Findings:      The perianal and digital rectal examinations were normal.      There was evidence of a prior surgical anastomosis in the ascending       colon. This was patent and was characterized by healthy appearing mucosa.      Many small-mouthed diverticula were found in the sigmoid colon.      Non-bleeding internal hemorrhoids were found during retroflexion.  The       hemorrhoids were Grade I (internal hemorrhoids that do not prolapse).      Normal mucosa was found in the entire colon. Biopsies for histology were       taken with a cold forceps from the entire colon for evaluation of       microscopic colitis. Estimated blood loss was minimal.      The exam was otherwise without abnormality on direct and retroflexion       views. Impression:            Due to computer issues only one picture was captured.                        - Patent surgical anastomosis, characterized by                         healthy appearing mucosa.                        - Non-bleeding internal hemorrhoids.                        - Normal mucosa in the entire examined colon. Biopsied.                        - The examination was otherwise normal on direct and                         retroflexion views.                        - Diverticulosis in the sigmoid colon. Recommendation:        - Discharge patient to home.                        - Resume previous diet.                        - Continue present medications.                        -  Await pathology results.                        - Repeat colonoscopy in 5 years for surveillance.                        - Return to referring physician as previously                         scheduled. Procedure Code(s):     --- Professional ---                        [phone], Colonoscopy, flexible; with biopsy, single or                         multiple Diagnosis Code(s):     --- Professional ---                        [phone], Personal history of other malignant neoplasm                         of large intestine                        Z98.0, Intestinal bypass and anastomosis status                        K64.0, First degree hemorrhoids                        K57.30, Diverticulosis of large intestine without                         perforation or abscess without bleeding CPT copyright 2019 American Medical Association. All  rights reserved. The codes documented in this report are preliminary and upon coder review may  be revised to meet current compliance requirements. Andrey Farmer, MD Andrey Farmer MD, MD 10/15/2020 9:48:18 AM Number of Addenda: 0 Note Initiated On: 10/15/2020 9:17 AM Scope Withdrawal Time: 0 hours 7 minutes 6 seconds  Total Procedure Duration: 0 hours 14 minutes 18 seconds  Estimated Blood Loss:  Estimated blood loss was minimal.      [facility]

## 2020-10-15 NOTE — Anesthesia Preprocedure Evaluation (Signed)
Anesthesia Evaluation  Patient identified by MRN, date of birth, ID band Patient awake    Reviewed: Allergy & Precautions, NPO status , Patient's Chart, lab work & pertinent test results  History of Anesthesia Complications Negative for: history of anesthetic complications  Airway Mallampati: II  TM Distance: >3 FB Neck ROM: Full    Dental  (+) Implants   Pulmonary neg pulmonary ROS, neg sleep apnea, neg COPD,    breath sounds clear to auscultation- rhonchi (-) wheezing      Cardiovascular Exercise Tolerance: Good hypertension, Pt. on medications (-) CAD, (-) Past MI, (-) Cardiac Stents and (-) CABG  Rhythm:Regular Rate:Normal - Systolic murmurs and - Diastolic murmurs    Neuro/Psych neg Seizures negative neurological ROS  negative psych ROS   GI/Hepatic Neg liver ROS, GERD  ,  Endo/Other  negative endocrine ROSneg diabetes  Renal/GU negative Renal ROS     Musculoskeletal negative musculoskeletal ROS (+)   Abdominal (+) - obese,   Peds  Hematology negative hematology ROS (+)   Anesthesia Other Findings Past Medical History: No date: Cancer ([facility])     Comment:  COLON CANCER No date: Colon cancer ([facility]) No date: GERD (gastroesophageal reflux disease) No date: GERD without esophagitis No date: History of colon polyps No date: Hyperlipidemia No date: Hypertension No date: Osteopenia No date: Osteopenia of multiple sites No date: Rosacea   Reproductive/Obstetrics                             Anesthesia Physical Anesthesia Plan  ASA: II  Anesthesia Plan: General   Post-op Pain Management:    Induction: Intravenous  PONV Risk Score and Plan: 2 and Propofol infusion  Airway Management Planned: Natural Airway  Additional Equipment:   Intra-op Plan:   Post-operative Plan:   Informed Consent: I have reviewed the patients History and Physical, chart, labs and discussed the  procedure including the risks, benefits and alternatives for the proposed anesthesia with the patient or authorized representative who has indicated his/her understanding and acceptance.     Dental advisory given  Plan Discussed with: CRNA and Anesthesiologist  Anesthesia Plan Comments:         Anesthesia Quick Evaluation

## 2020-10-18 ENCOUNTER — Encounter: Payer: Self-pay | Admitting: Gastroenterology

## 2020-10-18 LAB — SURGICAL PATHOLOGY

## 2021-05-12 ENCOUNTER — Other Ambulatory Visit: Payer: Self-pay | Admitting: Family Medicine

## 2021-05-12 DIAGNOSIS — Z1231 Encounter for screening mammogram for malignant neoplasm of breast: Secondary | ICD-10-CM

## 2021-06-30 ENCOUNTER — Other Ambulatory Visit: Payer: Self-pay

## 2021-06-30 ENCOUNTER — Ambulatory Visit
Admission: RE | Admit: 2021-06-30 | Discharge: 2021-06-30 | Disposition: A | Discharge status: Home / Self Care | Payer: Medicare HMO | Source: Ambulatory Visit | Attending: Family Medicine | Admitting: Family Medicine

## 2021-06-30 DIAGNOSIS — Z1231 Encounter for screening mammogram for malignant neoplasm of breast: Secondary | ICD-10-CM | POA: Diagnosis present

## 2021-10-19 ENCOUNTER — Encounter
Admission: RE | Disposition: A | Discharge status: Home / Self Care | Payer: Self-pay | Source: Ambulatory Visit | Attending: Internal Medicine

## 2021-10-19 ENCOUNTER — Ambulatory Visit
Admission: RE | Admit: 2021-10-19 | Discharge: 2021-10-19 | Disposition: A | Discharge status: Home / Self Care | Payer: Medicare HMO | Source: Ambulatory Visit | Attending: Internal Medicine | Admitting: Internal Medicine

## 2021-10-19 ENCOUNTER — Ambulatory Visit: Payer: Medicare HMO | Admitting: Certified Registered Nurse Anesthetist

## 2021-10-19 ENCOUNTER — Encounter: Payer: Self-pay | Admitting: Internal Medicine

## 2021-10-19 ENCOUNTER — Other Ambulatory Visit: Payer: Self-pay

## 2021-10-19 DIAGNOSIS — R197 Diarrhea, unspecified: Secondary | ICD-10-CM | POA: Diagnosis not present

## 2021-10-19 DIAGNOSIS — R768 Other specified abnormal immunological findings in serum: Secondary | ICD-10-CM | POA: Diagnosis not present

## 2021-10-19 DIAGNOSIS — K2970 Gastritis, unspecified, without bleeding: Secondary | ICD-10-CM | POA: Insufficient documentation

## 2021-10-19 DIAGNOSIS — I10 Essential (primary) hypertension: Secondary | ICD-10-CM | POA: Diagnosis not present

## 2021-10-19 DIAGNOSIS — E785 Hyperlipidemia, unspecified: Secondary | ICD-10-CM | POA: Insufficient documentation

## 2021-10-19 DIAGNOSIS — K219 Gastro-esophageal reflux disease without esophagitis: Secondary | ICD-10-CM | POA: Diagnosis present

## 2021-10-19 DIAGNOSIS — K449 Diaphragmatic hernia without obstruction or gangrene: Secondary | ICD-10-CM | POA: Insufficient documentation

## 2021-10-19 HISTORY — PX: ESOPHAGOGASTRODUODENOSCOPY (EGD) WITH PROPOFOL: SHX5813

## 2021-10-19 SURGERY — ESOPHAGOGASTRODUODENOSCOPY (EGD) WITH PROPOFOL
Anesthesia: General

## 2021-10-19 MED ORDER — LIDOCAINE HCL (CARDIAC) PF 100 MG/5ML IV SOSY
PREFILLED_SYRINGE | INTRAVENOUS | Status: DC | PRN
  Administered 2021-10-19: 80 mg via INTRAVENOUS

## 2021-10-19 MED ORDER — PROPOFOL 10 MG/ML IV BOLUS
INTRAVENOUS | Status: DC | PRN
  Administered 2021-10-19: 70 mg via INTRAVENOUS
  Administered 2021-10-19: 30 mg via INTRAVENOUS

## 2021-10-19 MED ORDER — SODIUM CHLORIDE 0.9 % IV SOLN: INTRAVENOUS | Status: DC

## 2021-10-19 NOTE — Op Note (Signed)
[facility] Gastroenterology Patient Name: Brittany Franklin Procedure Date: 10/19/2021 11:13 AM MRN: [id number] Account #: [id number] Date of Birth: 01-02-1951 Admit Type: Outpatient Age: 70 Room: [facility] ENDO ROOM 2 Gender: Female Note Status: Finalized Instrument Name: Upper Endoscope [id number] Procedure:             Upper GI endoscopy Indications:           Gastro-esophageal reflux disease, Positive celiac                         serologies, Endoscopy to assess diarrhea in patient                         suspected of having celiac disease Providers:             Benay Pike. Alice Reichert MD, MD Referring MD:          Dion Body (Referring MD) Medicines:             Propofol per Anesthesia Complications:         No immediate complications. Procedure:             Pre-Anesthesia Assessment:                        - The risks and benefits of the procedure and the                         sedation options and risks were discussed with the                         patient. All questions were answered and informed                         consent was obtained.                        - Patient identification and proposed procedure were                         verified prior to the procedure by the nurse. The                         procedure was verified in the procedure room.                        - ASA Grade Assessment: III - A patient with severe                         systemic disease.                        - After reviewing the risks and benefits, the patient                         was deemed in satisfactory condition to undergo the                         procedure.                        After  obtaining informed consent, the endoscope was                         passed under direct vision. Throughout the procedure,                         the patient's blood pressure, pulse, and oxygen                         saturations were monitored continuously. The Endoscope                          was introduced through the mouth, and advanced to the                         third part of duodenum. The upper GI endoscopy was                         accomplished without difficulty. The patient tolerated                         the procedure well. Findings:      The esophagus was normal.      Patchy moderate inflammation characterized by erosions and erythema was       found in the gastric antrum.      A 1 cm hiatal hernia was present.      The examined duodenum was normal. Biopsies for histology were taken with       a cold forceps for evaluation of celiac disease.      The exam was otherwise without abnormality. Impression:            - Normal esophagus.                        - Gastritis.                        - 1 cm hiatal hernia.                        - Normal examined duodenum. Biopsied.                        - The examination was otherwise normal. Recommendation:        - Patient has a contact number available for                         emergencies. The signs and symptoms of potential                         delayed complications were discussed with the patient.                         Return to normal activities tomorrow. Written                         discharge instructions were provided to the patient.                        - Resume previous  diet.                        - Continue present medications.                        - Await pathology results.                        - Return to nurse practitioner in 6 weeks.                        - Follow up with Stephens November, GI Nurse                         Practioner, in office to discuss results and monitor                         progress.                        - The findings and recommendations were discussed with                         the patient. Procedure Code(s):     --- Professional ---                        [phone], Esophagogastroduodenoscopy, flexible,                          transoral; with biopsy, single or multiple Diagnosis Code(s):     --- Professional ---                        R19.7, Diarrhea, unspecified                        R76.8, Other specified abnormal immunological findings                         in serum                        K21.9, Gastro-esophageal reflux disease without                         esophagitis                        K44.9, Diaphragmatic hernia without obstruction or                         gangrene                        K29.70, Gastritis, unspecified, without bleeding CPT copyright 2019 American Medical Association. All rights reserved. The codes documented in this report are preliminary and upon coder review may  be revised to meet current compliance requirements. Efrain Sella MD, MD 10/19/2021 11:35:07 AM This report has been signed electronically. Number of Addenda: 0 Note Initiated On: 10/19/2021 11:13 AM Estimated Blood Loss:  Estimated blood loss: none.      [facility]

## 2021-10-19 NOTE — Transfer of Care (Signed)
Immediate Anesthesia Transfer of Care Note  Patient: Brittany Franklin  Procedure(s) Performed: ESOPHAGOGASTRODUODENOSCOPY (EGD) WITH PROPOFOL  Patient Location: PACU and Endoscopy Unit  Anesthesia Type:General  Level of Consciousness: awake, drowsy and patient cooperative  Airway & Oxygen Therapy: Patient Spontanous Breathing  Post-op Assessment: Report given to RN and Post -op Vital signs reviewed and stable  Post vital signs: Reviewed and stable  Last Vitals:  Vitals Value Taken Time  BP 157/73 10/19/21 1134  Temp 36.2 C 10/19/21 1134  Pulse 63 10/19/21 1134  Resp 14 10/19/21 1134  SpO2 97 % 10/19/21 1134  Vitals shown include unvalidated device data.  Last Pain:  Vitals:   10/19/21 1134  TempSrc: Temporal  PainSc: Asleep         Complications: No notable events documented.

## 2021-10-19 NOTE — H&P (Signed)
Outpatient short stay form Pre-procedure 10/19/2021 11:10 AM Brittany Durkin K. Alice Reichert, M.D.  Primary Physician: Dion Body, M.D.  Reason for visit:  Positive celiac Ab, Diarrhea, hx GERD.  History of present illness:  Ms. Apt was last seen on 08/05/20 to arrange surveillance colonoscopy due to her history of colon cancer, and gerd/lymphocytic colitis. Colonoscopy done as above; gerd and lymphocytic colitis controlled on prn immodium and tums/lifestyle mods Since the last visit, has been having some problems with her gerd and microscopic colitis; 1. Jerrye Bushy- has not been on any prescriptive meds for a while. Feels now like her esophagus is not happy. Has been waking in the middle of the night with upper abdominal pain and coughing as she feels the back of her throat is irritated- eating foods triggers her acid reflux, stomach churning, and gas takes tums and gasx quickly . No nsaids. Endorses 1-2 glasses/wine in the evening. Has a mostly healthy diet, usually avoids overeating. 2. Microscopic colitis- states she has been having trouble with diarrhea for the last 40m has had less than a dozen normal bowel movement- if she is not extremely careful with her eating then she has several diarrheal stools at night. Had and episode of fecal incontinence once. Now does not want to go places as she is worried that this will happen again. States the immodium is not as helpful- has been taking up to 5. 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 We did some celiac tesing and the time and her IgA was too low to make a valid test so we assessed for the HLA genetics- she was at high risk for celiac and this was reviewed with Dr SGustavo Lah Her duodenum appeared normal on 2018 egd. When patient and I discussed repeating egd for duodenal biopsies she declined as she was feeling well.  She denies any further GI  concerns.  CBC/CMP unremarkable this year We reviewed her symptoms, gi history, test results and discused her plan of care.  Her questions were answered.    Current Facility-Administered Medications:    0.9 %  sodium chloride infusion, , Intravenous, Continuous, [place] Pike MD, Last Rate: 20 mL/hr at 10/19/21 1041, New Bag at 10/19/21 1041  Medications Prior to Admission  Medication Sig Dispense Refill Last Dose   calcium citrate-vitamin D (CITRACAL+D) 315-200 MG-UNIT tablet Take 2 tablets by mouth.   10/18/2021   ketoconazole (NIZORAL) 2 % shampoo Apply 1 application topically 2 (two) times a week. Leave on hair for 5 minutes   10/17/2021   omeprazole (PRILOSEC) 20 MG capsule Take 20 mg by mouth daily.   Past Week   Red Yeast Rice Extract (RED YEAST RICE PO) Take by mouth daily.   Past Week   alendronate (FOSAMAX) 70 MG tablet Take 70 mg by mouth once a week. Take with a full glass of water on an empty stomach.   10/17/2021   amLODipine (NORVASC) 5 MG tablet Take 5 mg by mouth daily. (Patient not taking: Reported on 10/15/2020)      Azelaic Acid (FINACEA) 15 % cream Apply topically daily. After skin is thoroughly washed and patted dry, gently but thoroughly massage a thin film of azelaic acid cream into the affected area twice daily, in the morning and evening. (Patient not taking: Reported on 10/15/2020)      azelastine (ASTELIN) 0.1 % nasal spray Place into both nostrils 2 (two) times daily. Use in each  nostril as directed (Patient not taking: Reported on 10/19/2021)   Not Taking   cetirizine (ZYRTEC) 10 MG tablet Take 10 mg by mouth daily. (Patient not taking: Reported on 10/19/2021)   Not Taking   co-enzyme Q-10 50 MG capsule Take 200 mg by mouth daily. (Patient not taking: Reported on 10/19/2021)   Not Taking   esomeprazole (NEXIUM) 20 MG capsule Take 20 mg by mouth daily at 12 noon.      levocetirizine (XYZAL) 5 MG tablet Take 5 mg by mouth every evening. (Patient not taking:  Reported on 10/19/2021)   Not Taking   losartan (COZAAR) 100 MG tablet Take 100 mg by mouth daily. (Patient not taking: Reported on 10/19/2021)   Not Taking   metroNIDAZOLE (METROCREAM) 0.75 % cream Apply topically 2 (two) times daily.      Multiple Vitamin (MULTIVITAMIN) tablet Take 1 tablet by mouth daily.      pantoprazole (PROTONIX) 40 MG tablet Take 40 mg by mouth daily.      potassium bicarbonate (K-LYTE) 25 MEQ disintegrating tablet Take 25 mEq by mouth 2 (two) times daily. (Patient not taking: Reported on 10/19/2021)   Not Taking     No Known Allergies   Past Medical History:  Diagnosis Date   Cancer ([place])    COLON CANCER   Colon cancer ([place])    GERD (gastroesophageal reflux disease)    GERD without esophagitis    History of colon polyps    Hyperlipidemia    Hypertension    Osteopenia    Osteopenia of multiple sites    Rosacea     Review of systems:  Otherwise negative.    Physical Exam  Gen: Alert, oriented. Appears stated age.  HEENT: [place]/AT. PERRLA. Lungs: CTA, no wheezes. CV: RR nl S1, S2. Abd: soft, benign, no masses. BS+ Ext: No edema. Pulses 2+    Planned procedures: Proceed with EGD. The patient understands the nature of the planned procedure, indications, risks, alternatives and potential complications including but not limited to bleeding, infection, perforation, damage to internal organs and possible oversedation/side effects from anesthesia. The patient agrees and gives consent to proceed.  Please refer to procedure notes for findings, recommendations and patient disposition/instructions.     Shayden Gingrich K. Alice Reichert, M.D. Gastroenterology 10/19/2021  11:10 AM

## 2021-10-19 NOTE — Anesthesia Postprocedure Evaluation (Signed)
Anesthesia Post Note  Patient: Brittany Franklin  Procedure(s) Performed: ESOPHAGOGASTRODUODENOSCOPY (EGD) WITH PROPOFOL  Patient location during evaluation: Endoscopy Anesthesia Type: General Level of consciousness: awake and alert Pain management: pain level controlled Vital Signs Assessment: post-procedure vital signs reviewed and stable Respiratory status: spontaneous breathing, nonlabored ventilation, respiratory function stable and patient connected to nasal cannula oxygen Cardiovascular status: blood pressure returned to baseline and stable Postop Assessment: no apparent nausea or vomiting Anesthetic complications: no   No notable events documented.   Last Vitals:  Vitals:   10/19/21 1144 10/19/21 1154  BP: (!) 154/79 (!) 161/72  Pulse: 66 60  Resp: 16 14  Temp:    SpO2: 99% 100%    Last Pain:  Vitals:   10/19/21 1154  TempSrc:   PainSc: 0-No pain                 Margaree Mackintosh

## 2021-10-19 NOTE — Interval H&P Note (Signed)
History and Physical Interval Note:  10/19/2021 11:11 AM  Brittany Franklin  has presented today for surgery, with the diagnosis of GERD k21.00 DIARRHEA R19.7.  The various methods of treatment have been discussed with the patient and family. After consideration of risks, benefits and other options for treatment, the patient has consented to  Procedure(s): ESOPHAGOGASTRODUODENOSCOPY (EGD) WITH PROPOFOL (N/A) as a surgical intervention.  The patient's history has been reviewed, patient examined, no change in status, stable for surgery.  I have reviewed the patient's chart and labs.  Questions were answered to the patient's satisfaction.     [place], [place]

## 2021-10-19 NOTE — Anesthesia Preprocedure Evaluation (Signed)
Anesthesia Evaluation  Patient identified by MRN, date of birth, ID band Patient awake    Reviewed: Allergy & Precautions, NPO status , Patient's Chart, lab work & pertinent test results  Airway Mallampati: II  TM Distance: >3 FB Neck ROM: full    Dental no notable dental hx.    Pulmonary neg pulmonary ROS,    Pulmonary exam normal        Cardiovascular hypertension, negative cardio ROS Normal cardiovascular exam     Neuro/Psych negative neurological ROS  negative psych ROS   GI/Hepatic negative GI ROS, Neg liver ROS,   Endo/Other  negative endocrine ROS  Renal/GU negative Renal ROS  negative genitourinary   Musculoskeletal   Abdominal Normal abdominal exam  (+)   Peds  Hematology negative hematology ROS (+)   Anesthesia Other Findings Past Medical History: No date: Cancer ([place])     Comment:  COLON CANCER No date: Colon cancer ([place]) No date: GERD (gastroesophageal reflux disease) No date: GERD without esophagitis No date: History of colon polyps No date: Hyperlipidemia No date: Hypertension No date: Osteopenia No date: Osteopenia of multiple sites No date: Rosacea  Past Surgical History: No date: ABDOMINAL HYSTERECTOMY No date: CHOLECYSTECTOMY No date: COLON SURGERY 10/30/2017: COLONOSCOPY WITH PROPOFOL; N/A     Comment:  Procedure: COLONOSCOPY WITH PROPOFOL;  Surgeon:               Lollie Sails, MD;  Location: [facility] ENDOSCOPY;                Service: Endoscopy;  Laterality: N/A; 10/15/2020: COLONOSCOPY WITH PROPOFOL; N/A     Comment:  Procedure: COLONOSCOPY WITH PROPOFOL;  Surgeon:               Lesly Rubenstein, MD;  Location: [facility] ENDOSCOPY;                Service: Endoscopy;  Laterality: N/A; 10/30/2017: ESOPHAGOGASTRODUODENOSCOPY (EGD) WITH PROPOFOL; N/A     Comment:  Procedure: ESOPHAGOGASTRODUODENOSCOPY (EGD) WITH               PROPOFOL;  Surgeon: Lollie Sails, MD;  Location:                [facility] ENDOSCOPY;  Service: Endoscopy;  Laterality: N/A; No date: TONSILLECTOMY  BMI    Body Mass Index: 24.12 kg/m      Reproductive/Obstetrics negative OB ROS                             Anesthesia Physical Anesthesia Plan  ASA: 3  Anesthesia Plan: General   Post-op Pain Management:    Induction: Intravenous  PONV Risk Score and Plan: Propofol infusion and TIVA  Airway Management Planned: Natural Airway and Nasal Cannula  Additional Equipment:   Intra-op Plan:   Post-operative Plan:   Informed Consent: I have reviewed the patients History and Physical, chart, labs and discussed the procedure including the risks, benefits and alternatives for the proposed anesthesia with the patient or authorized representative who has indicated his/her understanding and acceptance.     Dental Advisory Given  Plan Discussed with: Anesthesiologist, CRNA and Surgeon  Anesthesia Plan Comments: (Patient consented for risks of anesthesia including but not limited to:  - adverse reactions to medications - risk of airway placement if required - damage to eyes, teeth, lips or other oral mucosa - nerve damage due to positioning  - sore throat or hoarseness - Damage  to heart, brain, nerves, lungs, other parts of body or loss of life  Patient voiced understanding.)        Anesthesia Quick Evaluation

## 2021-10-20 ENCOUNTER — Encounter: Payer: Self-pay | Admitting: Internal Medicine

## 2021-10-20 LAB — SURGICAL PATHOLOGY

## 2022-06-01 ENCOUNTER — Other Ambulatory Visit: Payer: Self-pay | Admitting: Family Medicine

## 2022-06-01 DIAGNOSIS — Z1231 Encounter for screening mammogram for malignant neoplasm of breast: Secondary | ICD-10-CM

## 2022-06-23 ENCOUNTER — Other Ambulatory Visit: Payer: Self-pay | Admitting: Sports Medicine

## 2022-06-23 DIAGNOSIS — M76891 Other specified enthesopathies of right lower limb, excluding foot: Secondary | ICD-10-CM

## 2022-06-23 DIAGNOSIS — M25551 Pain in right hip: Secondary | ICD-10-CM

## 2022-06-23 DIAGNOSIS — M7061 Trochanteric bursitis, right hip: Secondary | ICD-10-CM

## 2022-06-29 ENCOUNTER — Ambulatory Visit
Admission: RE | Admit: 2022-06-29 | Discharge: 2022-06-29 | Disposition: A | Discharge status: Home / Self Care | Payer: Medicare HMO | Source: Ambulatory Visit | Attending: Sports Medicine | Admitting: Sports Medicine

## 2022-06-29 DIAGNOSIS — M7061 Trochanteric bursitis, right hip: Secondary | ICD-10-CM | POA: Insufficient documentation

## 2022-06-29 DIAGNOSIS — M76891 Other specified enthesopathies of right lower limb, excluding foot: Secondary | ICD-10-CM | POA: Diagnosis present

## 2022-06-29 DIAGNOSIS — M25551 Pain in right hip: Secondary | ICD-10-CM | POA: Insufficient documentation

## 2022-07-03 ENCOUNTER — Ambulatory Visit
Admission: RE | Admit: 2022-07-03 | Discharge: 2022-07-03 | Disposition: A | Discharge status: Home / Self Care | Payer: Medicare HMO | Source: Ambulatory Visit | Attending: Family Medicine | Admitting: Family Medicine

## 2022-07-03 DIAGNOSIS — Z1231 Encounter for screening mammogram for malignant neoplasm of breast: Secondary | ICD-10-CM | POA: Insufficient documentation

## 2022-07-06 ENCOUNTER — Other Ambulatory Visit: Payer: Self-pay | Admitting: Family Medicine

## 2022-07-06 DIAGNOSIS — R928 Other abnormal and inconclusive findings on diagnostic imaging of breast: Secondary | ICD-10-CM

## 2022-07-12 ENCOUNTER — Ambulatory Visit
Admission: RE | Admit: 2022-07-12 | Discharge: 2022-07-12 | Disposition: A | Discharge status: Home / Self Care | Payer: Medicare HMO | Source: Ambulatory Visit | Attending: Family Medicine | Admitting: Family Medicine

## 2022-07-12 DIAGNOSIS — R928 Other abnormal and inconclusive findings on diagnostic imaging of breast: Secondary | ICD-10-CM

## 2022-07-14 ENCOUNTER — Other Ambulatory Visit: Payer: Self-pay | Admitting: Internal Medicine

## 2022-07-14 DIAGNOSIS — R928 Other abnormal and inconclusive findings on diagnostic imaging of breast: Secondary | ICD-10-CM

## 2022-07-19 ENCOUNTER — Ambulatory Visit
Admission: RE | Admit: 2022-07-19 | Discharge: 2022-07-19 | Disposition: A | Discharge status: Home / Self Care | Payer: Medicare HMO | Source: Ambulatory Visit | Attending: Internal Medicine | Admitting: Internal Medicine

## 2022-07-19 DIAGNOSIS — R928 Other abnormal and inconclusive findings on diagnostic imaging of breast: Secondary | ICD-10-CM | POA: Insufficient documentation

## 2022-07-19 HISTORY — PX: BREAST BIOPSY: SHX20

## 2022-07-20 LAB — SURGICAL PATHOLOGY

## 2022-08-01 ENCOUNTER — Other Ambulatory Visit: Payer: Self-pay | Admitting: Orthopedic Surgery

## 2022-08-21 ENCOUNTER — Encounter
Admission: RE | Admit: 2022-08-21 | Discharge: 2022-08-21 | Disposition: A | Discharge status: Home / Self Care | Payer: Medicare HMO | Source: Ambulatory Visit | Attending: Orthopedic Surgery | Admitting: Orthopedic Surgery

## 2022-08-21 DIAGNOSIS — Z01818 Encounter for other preprocedural examination: Secondary | ICD-10-CM

## 2022-08-21 DIAGNOSIS — I10 Essential (primary) hypertension: Secondary | ICD-10-CM

## 2022-08-21 DIAGNOSIS — Z79899 Other long term (current) drug therapy: Secondary | ICD-10-CM

## 2022-08-21 HISTORY — DX: Other specified postprocedural states: R11.2

## 2022-08-21 HISTORY — DX: Personal history of other diseases of the digestive system: Z87.19

## 2022-08-21 HISTORY — DX: Other specified postprocedural states: Z98.890

## 2022-08-21 HISTORY — DX: Noninfective gastroenteritis and colitis, unspecified: K52.9

## 2022-08-21 NOTE — Patient Instructions (Signed)
Your procedure is scheduled on: 08-29-22 Tuesday Report to the Registration Desk on the 1st floor of the [facility].Then proceed to the 2nd floor Surgery Desk To find out your arrival time, please call [phone] between 1PM - 3PM on:08-28-22 Monday If your arrival time is 6:00 am, do not arrive prior to that time as the [facility] entrance doors do not open until 6:00 am.  REMEMBER: Instructions that are not followed completely may result in serious medical risk, up to and including death; or upon the discretion of your surgeon and anesthesiologist your surgery may need to be rescheduled.  Do not eat food after midnight the night before surgery.  No gum chewing, lozengers or hard candies.  You may however, drink CLEAR liquids up to 2 hours before you are scheduled to arrive for your surgery. Do not drink anything within 2 hours of your scheduled arrival time.  Clear liquids include: - water  - apple juice without pulp - gatorade (not RED colors) - black coffee or tea (Do NOT add milk or creamers to the coffee or tea) Do NOT drink anything that is not on this list.  In addition, your doctor has ordered for you to drink the provided  Ensure Pre-Surgery Clear Carbohydrate Drink  Drinking this carbohydrate drink up to two hours before surgery helps to reduce insulin resistance and improve patient outcomes. Please complete drinking 2 hours prior to scheduled arrival time.  TAKE THESE MEDICATIONS THE MORNING OF SURGERY WITH A SIP OF WATER: -budesonide (ENTOCORT EC)  -omeprazole (PRILOSEC)-take one the night before surgery and one the morning of surgery  One week prior to surgery: Stop Anti-inflammatories (NSAIDS) such as Advil, Aleve, Ibuprofen, Motrin, Naproxen, Naprosyn and Aspirin based products such as Excedrin, Goodys Powder, BC Powder.You may however, continue to take Tylenol if needed for pain up until the day of surgery.  Stop ANY OVER THE COUNTER supplements/vitamins NOW  (08-21-22) until after surgery (Citracal + D, Red Yeast Rice)  No Alcohol for 24 hours before or after surgery.  No Smoking including e-cigarettes for 24 hours prior to surgery.  No chewable tobacco products for at least 6 hours prior to surgery.  No nicotine patches on the day of surgery.  Do not use any "recreational" drugs for at least a week prior to your surgery.  Please be advised that the combination of cocaine and anesthesia may have negative outcomes, up to and including death. If you test positive for cocaine, your surgery will be cancelled.  On the morning of surgery brush your teeth with toothpaste and water, you may rinse your mouth with mouthwash if you wish. Do not swallow any toothpaste or mouthwash.  Use CHG Soap as directed on instruction sheet.  Do not wear jewelry, make-up, hairpins, clips or nail polish.  Do not wear lotions, powders, or perfumes.   Do not shave body from the neck down 48 hours prior to surgery just in case you cut yourself which could leave a site for infection.  Also, freshly shaved skin may become irritated if using the CHG soap.  Contact lenses, hearing aids and dentures may not be worn into surgery.  Do not bring valuables to the hospital. [facility] is not responsible for any missing/lost belongings or valuables.   Notify your doctor if there is any change in your medical condition (cold, fever, infection).  Wear comfortable clothing (specific to your surgery type) to the hospital.  After surgery, you can help prevent lung complications by  doing breathing exercises.  Take deep breaths and cough every 1-2 hours. Your doctor may order a device called an Incentive Spirometer to help you take deep breaths. When coughing or sneezing, hold a pillow firmly against your incision with both hands. This is called "splinting." Doing this helps protect your incision. It also decreases belly discomfort.  If you are being admitted to the hospital  overnight, leave your suitcase in the car. After surgery it may be brought to your room.  If you are being discharged the day of surgery, you will not be allowed to drive home. You will need a responsible adult (18 years or older) to drive you home and stay with you that night.   If you are taking public transportation, you will need to have a responsible adult (18 years or older) with you. Please confirm with your physician that it is acceptable to use public transportation.   Please call the [facility] at [phone] if you have any questions about these instructions.  Surgery Visitation Policy:  Patients undergoing a surgery or procedure may have two family members or support persons with them as long as the person is not COVID-19 positive or experiencing its symptoms.     How to Use an Incentive Spirometer An incentive spirometer is a tool that measures how well you are filling your lungs with each breath. Learning to take long, deep breaths using this tool can help you keep your lungs clear and active. This may help to reverse or lessen your chance of developing breathing (pulmonary) problems, especially infection. You may be asked to use a spirometer: After a surgery. If you have a lung problem or a history of smoking. After a long period of time when you have been unable to move or be active. If the spirometer includes an indicator to show the highest number that you have reached, your health care provider or respiratory therapist will help you set a goal. Keep a log of your progress as told by your health care provider. What are the risks? Breathing too quickly may cause dizziness or cause you to pass out. Take your time so you do not get dizzy or light-headed. If you are in pain, you may need to take pain medicine before doing incentive spirometry. It is harder to take a deep breath if you are having pain. How to use your incentive spirometer  Sit up on the  edge of your bed or on a chair. Hold the incentive spirometer so that it is in an upright position. Before you use the spirometer, breathe out normally. Place the mouthpiece in your mouth. Make sure your lips are closed tightly around it. Breathe in slowly and as deeply as you can through your mouth, causing the piston or the ball to rise toward the top of the chamber. Hold your breath for 3-5 seconds, or for as long as possible. If the spirometer includes a coach indicator, use this to guide you in breathing. Slow down your breathing if the indicator goes above the marked areas. Remove the mouthpiece from your mouth and breathe out normally. The piston or ball will return to the bottom of the chamber. Rest for a few seconds, then repeat the steps 10 or more times. Take your time and take a few normal breaths between deep breaths so that you do not get dizzy or light-headed. Do this every 1-2 hours when you are awake. If the spirometer includes a goal marker to show the  highest number you have reached (best effort), use this as a goal to work toward during each repetition. After each set of 10 deep breaths, cough a few times. This will help to make sure that your lungs are clear. If you have an incision on your chest or abdomen from surgery, place a pillow or a rolled-up towel firmly against the incision when you cough. This can help to reduce pain while taking deep breaths and coughing. General tips When you are able to get out of bed: Walk around often. Continue to take deep breaths and cough in order to clear your lungs. Keep using the incentive spirometer until your health care provider says it is okay to stop using it. If you have been in the hospital, you may be told to keep using the spirometer at home. Contact a health care provider if: You are having difficulty using the spirometer. You have trouble using the spirometer as often as instructed. Your pain medicine is not giving enough  relief for you to use the spirometer as told. You have a fever. Get help right away if: You develop shortness of breath. You develop a cough with bloody mucus from the lungs. You have fluid or blood coming from an incision site after you cough. Summary An incentive spirometer is a tool that can help you learn to take long, deep breaths to keep your lungs clear and active. You may be asked to use a spirometer after a surgery, if you have a lung problem or a history of smoking, or if you have been inactive for a long period of time. Use your incentive spirometer as instructed every 1-2 hours while you are awake. If you have an incision on your chest or abdomen, place a pillow or a rolled-up towel firmly against your incision when you cough. This will help to reduce pain. Get help right away if you have shortness of breath, you cough up bloody mucus, or blood comes from your incision when you cough. This information is not intended to replace advice given to you by your health care provider. Make sure you discuss any questions you have with your health care provider. Document Revised: 02/09/2020 Document Reviewed: 02/09/2020 Elsevier Patient Education  [place].

## 2022-08-22 ENCOUNTER — Encounter
Admission: RE | Admit: 2022-08-22 | Discharge: 2022-08-22 | Disposition: A | Discharge status: Home / Self Care | Payer: Medicare HMO | Source: Ambulatory Visit | Attending: Orthopedic Surgery | Admitting: Orthopedic Surgery

## 2022-08-22 DIAGNOSIS — Z79899 Other long term (current) drug therapy: Secondary | ICD-10-CM

## 2022-08-22 DIAGNOSIS — Z01818 Encounter for other preprocedural examination: Secondary | ICD-10-CM | POA: Diagnosis present

## 2022-08-22 DIAGNOSIS — I10 Essential (primary) hypertension: Secondary | ICD-10-CM | POA: Diagnosis not present

## 2022-08-22 LAB — BASIC METABOLIC PANEL
Anion gap: 8 (ref 5–15)
BUN: 17 mg/dL (ref 8–23)
CO2: 27 mmol/L (ref 22–32)
Calcium: 10.1 mg/dL (ref 8.9–10.3)
Chloride: 104 mmol/L (ref 98–111)
Creatinine, Ser: 0.82 mg/dL (ref 0.44–1.00)
GFR, Estimated: >60 mL/min (ref >60)
Glucose, Bld: 93 mg/dL (ref 70–99)
Potassium: 3.5 mmol/L (ref 3.5–5.1)
Sodium: 139 mmol/L (ref 135–145)

## 2022-08-28 MED ORDER — CEFAZOLIN SODIUM-DEXTROSE 2-4 GM/100ML-% IV SOLN
2.0000 g | INTRAVENOUS | Status: AC
  Administered 2022-08-29: 2 g via INTRAVENOUS

## 2022-08-28 MED ORDER — ORAL CARE MOUTH RINSE: 15.0000 mL | Freq: Once | OROMUCOSAL | Status: AC

## 2022-08-28 MED ORDER — CHLORHEXIDINE GLUCONATE 0.12 % MT SOLN: 15.0000 mL | Freq: Once | OROMUCOSAL | Status: AC

## 2022-08-28 MED ORDER — LACTATED RINGERS IV SOLN: INTRAVENOUS | Status: DC

## 2022-08-29 ENCOUNTER — Ambulatory Visit: Payer: Medicare HMO

## 2022-08-29 ENCOUNTER — Ambulatory Visit
Admission: RE | Admit: 2022-08-29 | Discharge: 2022-08-29 | Disposition: A | Discharge status: Home / Self Care | Payer: Medicare HMO | Attending: Orthopedic Surgery | Admitting: Orthopedic Surgery

## 2022-08-29 ENCOUNTER — Other Ambulatory Visit: Payer: Self-pay

## 2022-08-29 ENCOUNTER — Ambulatory Visit: Payer: Medicare HMO | Admitting: Urgent Care

## 2022-08-29 ENCOUNTER — Encounter
Admission: RE | Disposition: A | Discharge status: Home / Self Care | Payer: Self-pay | Source: Home / Self Care | Attending: Orthopedic Surgery

## 2022-08-29 ENCOUNTER — Ambulatory Visit: Payer: Medicare HMO | Admitting: Certified Registered Nurse Anesthetist

## 2022-08-29 ENCOUNTER — Encounter: Payer: Self-pay | Admitting: Orthopedic Surgery

## 2022-08-29 DIAGNOSIS — M24051 Loose body in right hip: Secondary | ICD-10-CM | POA: Insufficient documentation

## 2022-08-29 DIAGNOSIS — M7061 Trochanteric bursitis, right hip: Secondary | ICD-10-CM | POA: Diagnosis present

## 2022-08-29 DIAGNOSIS — X58XXXA Exposure to other specified factors, initial encounter: Secondary | ICD-10-CM | POA: Diagnosis not present

## 2022-08-29 DIAGNOSIS — Z5331 Laparoscopic surgical procedure converted to open procedure: Secondary | ICD-10-CM | POA: Insufficient documentation

## 2022-08-29 DIAGNOSIS — K219 Gastro-esophageal reflux disease without esophagitis: Secondary | ICD-10-CM | POA: Insufficient documentation

## 2022-08-29 DIAGNOSIS — S76011A Strain of muscle, fascia and tendon of right hip, initial encounter: Secondary | ICD-10-CM | POA: Diagnosis not present

## 2022-08-29 DIAGNOSIS — I10 Essential (primary) hypertension: Secondary | ICD-10-CM | POA: Diagnosis not present

## 2022-08-29 DIAGNOSIS — Z8249 Family history of ischemic heart disease and other diseases of the circulatory system: Secondary | ICD-10-CM | POA: Diagnosis not present

## 2022-08-29 DIAGNOSIS — Z79899 Other long term (current) drug therapy: Secondary | ICD-10-CM | POA: Insufficient documentation

## 2022-08-29 SURGERY — ARTHROSCOPIC TROCHANTERIC BURSECTOMY
Anesthesia: General | Site: Hip | Laterality: Right

## 2022-08-29 MED ORDER — DEXAMETHASONE SODIUM PHOSPHATE 10 MG/ML IJ SOLN
INTRAMUSCULAR | Status: DC | PRN
  Administered 2022-08-29: 10 mg via INTRAVENOUS

## 2022-08-29 MED ORDER — PROPOFOL 10 MG/ML IV BOLUS
INTRAVENOUS | Status: DC | PRN
  Administered 2022-08-29: 120 mg via INTRAVENOUS

## 2022-08-29 MED ORDER — OXYCODONE HCL 5 MG PO TABS
ORAL_TABLET | ORAL | Status: AC
  Administered 2022-08-29: 5 mg via ORAL
  Filled 2022-08-29: qty 1

## 2022-08-29 MED ORDER — ACETAMINOPHEN 10 MG/ML IV SOLN
INTRAVENOUS | Status: DC | PRN
  Administered 2022-08-29: 1000 mg via INTRAVENOUS

## 2022-08-29 MED ORDER — ONDANSETRON HCL 4 MG/2ML IJ SOLN
INTRAMUSCULAR | Status: AC
  Filled 2022-08-29: qty 2

## 2022-08-29 MED ORDER — DEXAMETHASONE SODIUM PHOSPHATE 10 MG/ML IJ SOLN
INTRAMUSCULAR | Status: AC
  Filled 2022-08-29: qty 1

## 2022-08-29 MED ORDER — CEFAZOLIN SODIUM-DEXTROSE 2-4 GM/100ML-% IV SOLN
INTRAVENOUS | Status: AC
  Filled 2022-08-29: qty 100

## 2022-08-29 MED ORDER — ONDANSETRON 4 MG PO TBDP: 4.0000 mg | ORAL_TABLET | Freq: Three times a day (TID) | ORAL | 0 refills | Status: DC | PRN

## 2022-08-29 MED ORDER — ONDANSETRON HCL 4 MG/2ML IJ SOLN
INTRAMUSCULAR | Status: DC | PRN
  Administered 2022-08-29: 4 mg via INTRAVENOUS

## 2022-08-29 MED ORDER — LIDOCAINE-EPINEPHRINE 1 %-1:100000 IJ SOLN
INTRAMUSCULAR | Status: AC
  Filled 2022-08-29: qty 1

## 2022-08-29 MED ORDER — ACETAMINOPHEN 500 MG PO TABS: 1000.0000 mg | ORAL_TABLET | Freq: Three times a day (TID) | ORAL | 2 refills | Status: AC

## 2022-08-29 MED ORDER — ACETAMINOPHEN 10 MG/ML IV SOLN
INTRAVENOUS | Status: AC
  Filled 2022-08-29: qty 100

## 2022-08-29 MED ORDER — OXYCODONE HCL 5 MG PO TABS: 5.0000 mg | ORAL_TABLET | ORAL | 0 refills | Status: AC | PRN

## 2022-08-29 MED ORDER — PROPOFOL 10 MG/ML IV BOLUS
INTRAVENOUS | Status: AC
  Filled 2022-08-29: qty 40

## 2022-08-29 MED ORDER — BUPIVACAINE HCL (PF) 0.5 % IJ SOLN
INTRAMUSCULAR | Status: AC
  Filled 2022-08-29: qty 30

## 2022-08-29 MED ORDER — ROCURONIUM BROMIDE 100 MG/10ML IV SOLN
INTRAVENOUS | Status: DC | PRN
  Administered 2022-08-29: 10 mg via INTRAVENOUS
  Administered 2022-08-29: 50 mg via INTRAVENOUS
  Administered 2022-08-29: 30 mg via INTRAVENOUS
  Administered 2022-08-29 (×2): 20 mg via INTRAVENOUS
  Administered 2022-08-29: 10 mg via INTRAVENOUS

## 2022-08-29 MED ORDER — ONDANSETRON HCL 4 MG/2ML IJ SOLN: 4.0000 mg | Freq: Once | INTRAMUSCULAR | Status: DC | PRN

## 2022-08-29 MED ORDER — BUPIVACAINE LIPOSOME 1.3 % IJ SUSP
INTRAMUSCULAR | Status: DC | PRN
  Administered 2022-08-29: 20 mL via SURGICAL_CAVITY

## 2022-08-29 MED ORDER — LACTATED RINGERS IR SOLN
Status: DC | PRN
  Administered 2022-08-29: 7000 mL

## 2022-08-29 MED ORDER — LIDOCAINE HCL (CARDIAC) PF 100 MG/5ML IV SOSY
PREFILLED_SYRINGE | INTRAVENOUS | Status: DC | PRN
  Administered 2022-08-29: 20 mg via INTRAVENOUS
  Administered 2022-08-29: 80 mg via INTRAVENOUS

## 2022-08-29 MED ORDER — FENTANYL CITRATE (PF) 100 MCG/2ML IJ SOLN
INTRAMUSCULAR | Status: AC
  Administered 2022-08-29: 25 ug via INTRAVENOUS
  Filled 2022-08-29: qty 2

## 2022-08-29 MED ORDER — ASPIRIN 325 MG PO TBEC: 325.0000 mg | DELAYED_RELEASE_TABLET | Freq: Every day | ORAL | 0 refills | Status: AC

## 2022-08-29 MED ORDER — BUPIVACAINE LIPOSOME 1.3 % IJ SUSP
INTRAMUSCULAR | Status: AC
  Filled 2022-08-29: qty 20

## 2022-08-29 MED ORDER — GLYCOPYRROLATE 0.2 MG/ML IJ SOLN
INTRAMUSCULAR | Status: DC | PRN
  Administered 2022-08-29 (×2): .1 mg via INTRAVENOUS

## 2022-08-29 MED ORDER — SODIUM CHLORIDE 0.9 % IR SOLN
Status: DC | PRN
  Administered 2022-08-29: 500 mL

## 2022-08-29 MED ORDER — FENTANYL CITRATE (PF) 100 MCG/2ML IJ SOLN
INTRAMUSCULAR | Status: DC | PRN
  Administered 2022-08-29 (×2): 50 ug via INTRAVENOUS

## 2022-08-29 MED ORDER — SUGAMMADEX SODIUM 200 MG/2ML IV SOLN
INTRAVENOUS | Status: DC | PRN
  Administered 2022-08-29: 200 mg via INTRAVENOUS

## 2022-08-29 MED ORDER — CHLORHEXIDINE GLUCONATE 0.12 % MT SOLN
OROMUCOSAL | Status: AC
  Administered 2022-08-29: 15 mL via OROMUCOSAL
  Filled 2022-08-29: qty 15

## 2022-08-29 MED ORDER — OXYCODONE HCL 5 MG/5ML PO SOLN: 5.0000 mg | Freq: Once | ORAL | Status: AC | PRN

## 2022-08-29 MED ORDER — MIDAZOLAM HCL 2 MG/2ML IJ SOLN
INTRAMUSCULAR | Status: DC | PRN
  Administered 2022-08-29: 2 mg via INTRAVENOUS

## 2022-08-29 MED ORDER — OXYCODONE HCL 5 MG PO TABS: 5.0000 mg | ORAL_TABLET | Freq: Once | ORAL | Status: AC | PRN

## 2022-08-29 MED ORDER — ACETAMINOPHEN 10 MG/ML IV SOLN: 1000.0000 mg | Freq: Once | INTRAVENOUS | Status: DC | PRN

## 2022-08-29 MED ORDER — PROPOFOL 500 MG/50ML IV EMUL
INTRAVENOUS | Status: DC | PRN
  Administered 2022-08-29: 50 ug/kg/min via INTRAVENOUS

## 2022-08-29 MED ORDER — FENTANYL CITRATE (PF) 100 MCG/2ML IJ SOLN
25.0000 ug | INTRAMUSCULAR | Status: DC | PRN
  Administered 2022-08-29: 50 ug via INTRAVENOUS
  Administered 2022-08-29: 25 ug via INTRAVENOUS

## 2022-08-29 MED ORDER — FENTANYL CITRATE (PF) 100 MCG/2ML IJ SOLN
INTRAMUSCULAR | Status: AC
  Filled 2022-08-29: qty 2

## 2022-08-29 MED ORDER — MIDAZOLAM HCL 2 MG/2ML IJ SOLN
INTRAMUSCULAR | Status: AC
  Filled 2022-08-29: qty 2

## 2022-08-29 SURGICAL SUPPLY — 83 items
ADAPTER IRRIG TUBE 2 SPIKE SOL (ADAPTER) ×4 IMPLANT
ADH SKN CLS APL DERMABOND .7 (GAUZE/BANDAGES/DRESSINGS) ×2
ADPR TBG 2 SPK PMP STRL ASCP (ADAPTER) ×4
ANCH SUT 2 SWLK 19.1 CLS EYLT (Anchor) ×4 IMPLANT
ANCH SUT FBRTK 1.3X2.6X1.7 SLF (Anchor) ×2 IMPLANT
ANCH SUT TGRTAPE 1.3X2.6X1.7 (Anchor) ×2 IMPLANT
ANCHOR FIBERTAK 2.6X1.7 BLUE (Anchor) ×1 IMPLANT
ANCHOR SUT FBRTK 2.6 SP1.7 TAP (Anchor) ×1 IMPLANT
ANCHOR SWIVELOCK BIO 4.75X19.1 (Anchor) ×2 IMPLANT
APL PRP STRL LF DISP 70% ISPRP (MISCELLANEOUS) ×6
BLADE FULL RADIUS 3.5 (BLADE) IMPLANT
BLADE SHAVER 4.5X7 STR FR (MISCELLANEOUS) IMPLANT
BNDG ADH 1X3 SHEER STRL LF (GAUZE/BANDAGES/DRESSINGS) ×12 IMPLANT
BNDG ADH THN 3X1 STRL LF (GAUZE/BANDAGES/DRESSINGS) ×12
BNDG CMPR 5X4 CHSV STRCH STRL (GAUZE/BANDAGES/DRESSINGS) ×2
BNDG COHESIVE 4X5 TAN STRL LF (GAUZE/BANDAGES/DRESSINGS) ×1 IMPLANT
BUR BR 5.5 WIDE MOUTH (BURR) IMPLANT
CANNULA TWIST IN 8.25X7CM (CANNULA) ×2 IMPLANT
CHLORAPREP W/TINT 26 (MISCELLANEOUS) ×4 IMPLANT
COOLER POLAR GLACIER W/PUMP (MISCELLANEOUS) ×1 IMPLANT
COVER LIGHT HANDLE STERIS (MISCELLANEOUS) ×4 IMPLANT
DERMABOND ADVANCED .7 DNX12 (GAUZE/BANDAGES/DRESSINGS) ×1 IMPLANT
DEVICE SUCT BLK HOLE OR FLOOR (MISCELLANEOUS) ×1 IMPLANT
DRAPE C-ARM XRAY 36X54 (DRAPES) ×2 IMPLANT
DRAPE IMP U-DRAPE 54X76 (DRAPES) ×4 IMPLANT
DRAPE INCISE IOBAN 66X45 STRL (DRAPES) ×1 IMPLANT
DRAPE SURG 17X11 SM STRL (DRAPES) ×4 IMPLANT
DRAPE U-SHAPE 47X51 STRL (DRAPES) ×4 IMPLANT
DRSG OPSITE POSTOP 4X8 (GAUZE/BANDAGES/DRESSINGS) ×1 IMPLANT
DRSG TEGADERM 2-3/8X2-3/4 SM (GAUZE/BANDAGES/DRESSINGS) ×4 IMPLANT
DRSG XEROFORM 1X8 (GAUZE/BANDAGES/DRESSINGS) ×1 IMPLANT
GAUZE SPONGE 4X4 12PLY STRL (GAUZE/BANDAGES/DRESSINGS) ×2 IMPLANT
GAUZE XEROFORM 1X8 LF (GAUZE/BANDAGES/DRESSINGS) ×2 IMPLANT
GLOVE BIO SURGEON STRL SZ7.5 (GLOVE) ×4 IMPLANT
GLOVE BIO SURGEON STRL SZ8 (GLOVE) ×8 IMPLANT
GLOVE BIOGEL PI IND STRL 8 (GLOVE) ×2 IMPLANT
GLOVE SURG ORTHO 8.0 STRL STRW (GLOVE) ×2 IMPLANT
GLOVE SURG SYN 7.5  E (GLOVE) ×4
GLOVE SURG SYN 7.5 E (GLOVE) ×4 IMPLANT
GLOVE SURG SYN 7.5 PF PI (GLOVE) ×1 IMPLANT
GLOVE SURG UNDER LTX SZ8 (GLOVE) ×2 IMPLANT
GOWN STRL REUS W/ TWL LRG LVL3 (GOWN DISPOSABLE) ×7 IMPLANT
GOWN STRL REUS W/ TWL XL LVL3 (GOWN DISPOSABLE) ×3 IMPLANT
GOWN STRL REUS W/TWL LRG LVL3 (GOWN DISPOSABLE) ×12
GOWN STRL REUS W/TWL XL LVL3 (GOWN DISPOSABLE) ×4
IV NS IRRIG 3000ML ARTHROMATIC (IV SOLUTION) ×8 IMPLANT
KIT ANCHOR FBRTK 2.6 STR (KITS) ×1 IMPLANT
KIT TURNOVER KIT A (KITS) ×2 IMPLANT
MANIFOLD NEPTUNE II (INSTRUMENTS) ×3 IMPLANT
MAT ABSORB  FLUID 56X50 GRAY (MISCELLANEOUS) ×2
MAT ABSORB FLUID 56X50 GRAY (MISCELLANEOUS) ×2 IMPLANT
NDL MAYO CATGUT SZ6 (NEEDLE) ×1 IMPLANT
NDL SPNL 18GX3.5 QUINCKE PK (NEEDLE) IMPLANT
NEEDLE HYPO 22GX1.5 SAFETY (NEEDLE) ×2 IMPLANT
NEEDLE SPNL 18GX3.5 QUINCKE PK (NEEDLE) ×2 IMPLANT
NS IRRIG 500ML POUR BTL (IV SOLUTION) ×3 IMPLANT
PACK EXTREMITY ARMC (MISCELLANEOUS) ×2 IMPLANT
PACK HIP PROSTHESIS (MISCELLANEOUS) ×1 IMPLANT
PAD ABD DERMACEA PRESS 5X9 (GAUZE/BANDAGES/DRESSINGS) ×2 IMPLANT
PAD WRAPON POLOR MULTI XL (MISCELLANEOUS) ×1 IMPLANT
PASSER SUT FIRSTPASS SELF (INSTRUMENTS) ×1 IMPLANT
SLEEVE PROTECTION STRL DISP (MISCELLANEOUS) ×1 IMPLANT
SLEEVE REMOTE CONTROL 5X12 (DRAPES) IMPLANT
SPONGE GAUZE 2X2 8PLY STRL LF (GAUZE/BANDAGES/DRESSINGS) ×2 IMPLANT
SPONGE T-LAP 18X18 ~~LOC~~+RFID (SPONGE) ×2 IMPLANT
SUT ETHILON 3-0 FS-10 30 BLK (SUTURE) ×2
SUT MNCRL 4-0 (SUTURE) ×4
SUT MNCRL 4-0 27XMFL (SUTURE) ×4
SUT VIC AB 0 CT2 27 (SUTURE) ×1 IMPLANT
SUT VIC AB 2-0 CT2 27 (SUTURE) ×3 IMPLANT
SUTURE EHLN 3-0 FS-10 30 BLK (SUTURE) ×2 IMPLANT
SUTURE MNCRL 4-0 27XMF (SUTURE) ×2 IMPLANT
SYR 10ML LL (SYRINGE) ×2 IMPLANT
SYR 20ML LL LF (SYRINGE) ×2 IMPLANT
TAPE TRANSPORE STRL 2 31045 (GAUZE/BANDAGES/DRESSINGS) ×1 IMPLANT
TRAP FLUID SMOKE EVACUATOR (MISCELLANEOUS) ×2 IMPLANT
TUBING CONNECTING 10 (TUBING) ×1 IMPLANT
TUBING INFLOW SET DBFLO PUMP (TUBING) ×2 IMPLANT
TUBING OUTFLOW SET DBLFO PUMP (TUBING) ×2 IMPLANT
WAND HAND CNTRL MULTIVAC 50 (MISCELLANEOUS) ×2 IMPLANT
WATER STERILE IRR 500ML POUR (IV SOLUTION) ×2 IMPLANT
WRAP-ON POLOR PAD MULTI XL (MISCELLANEOUS) ×2
WRAPON POLOR PAD MULTI XL (MISCELLANEOUS) ×2

## 2022-08-29 NOTE — H&P (Signed)
Paper H&P to be scanned into permanent record. H&P reviewed. No significant changes noted.  

## 2022-08-29 NOTE — Op Note (Addendum)
DATE OF SURGERY:  08/29/2022   PREOPERATIVE DIAGNOSIS:  1. Right hip gluteus medius tear 2. Right hip trochanteric bursitis 3. Right peritrochanteric loose body   POSTOPERATIVE DIAGNOSIS:  1. Right hip gluteus medius tear 2. Right hip trochanteric bursitis 3. Right peritrochanteric loose body   PROCEDURE:  1. Right hip endoscopic loose body removal 2. Right hip open gluteus medius repair 3. Right hip open trochanteric bursectomy  SURGEON: Cato Mulligan, MD   ASSISTANTS: Anitra Lauth, PA; Evette Georges, PA-S    EBL: 50cc  ANESTHESIA: Gen  IMPLANTS: -Arthrex 2.6 mm knotless anchor x2 -Arthrex 4.75 mm SwiveLock anchor x2  INDICATION(S): The patient is a 71 y.o. female who presents with persistent hip pain and gait disturbance. The MRI revealed a likely gluteus medius tear, trochanteric bursitis and peritrochanteric loose body.  The patient has failed all non-operative care to date including multiple corticosteroid injections, physical therapy and activity modification.  Please see the preoperative notes for further detail.   The patient elected to undergo the above mentioned procedure after detailed explanation of the expected outcomes and recovery path.   Informed consent was obtained outlining the expected benefits and possible risks of the surgery highlighted by failure of the repair to heal or to retear. Other risks include continued pain and other general risks of surgery such as blood clots, infection, bleeding and nerve injury.  OPERATIVE FINDINGS:  A tear of the gluteus medius (high-grade partial-thickness undersurface tear) was present. Significant trochanteric bursitis.  Approximately 15x8 mm loose body superior to the greater trochanter.  OPERATIVE REPORT:   The patient was brought to the operating room and underwent anesthesia.  She was initially placed on the Hana table. The operative extremity was flexed approximately 10 degrees and abducted approximately 30 degrees.   The foot was internally rotated. All bony prominences were padded.  Appropriate IV antibiotics were administered.  The patient was prepped and draped in a sterile fashion.  Time-out was performed and landmarks were identified with fluoroscopic assistance.  Needle localization with fluoroscopy was used to make an anterior portal.  This was made approximately 2 cm anterior and proximal to the standard anterolateral portal at the level of the tip of the greater trochanter.  The blunt trocar was inserted deep to the IT band and along the greater trochanter.  The peritrochanteric space was opened with a blunt trocar.  Appropriate positioning was confirmed with fluoroscopy.  A 70 degree knee arthroscope was used for this procedure, and it was inserted.  Next, a distal anterolateral portal was established approximately 7 cm distal to the anterior portal just anterior to the IT band and greater trochanter. This was also done under needle localization to ensure appropriate trajectory.  A shaver was introduced.  Using combination of oscillating shaver and electrocautery wand, the significant amount of trochanteric bursa was excised. The greater trochanter was identified and the loose body superior to the greater trochanter was identified.  It was removed using an arthroscopic grasper.  It measured approximately 15 x 7 mm.  At this point, there was some difficulty with visualization gluteus medius tear and understanding the full tear pattern.  Decision was made to convert to an open procedure.  Portal incisions were closed with 3-0 nylon and covered with Tegaderm.  The patient was transitioned to a lateral position on the pegboard. All bony prominences were padded.  The patient was prepped and draped in a sterile fashion.  Time-out was performed and landmarks were identified.  A 12cm  incision was made laterally over the greater trochanter inline with the abductor muscle.  Sharp dissection was carried out down to the IT  band and blunt self retaining retractors were placed.  We incised the IT band and then placed a self retaining retractor deep.  The greater trochanter was visible and we then performed a thorough bursectomy using an electrocautery device such that the short external rotators were easily visible. The gluteus medius muscle and tendon were identified.  The area of torn gluteus medius was identified as there was a bubble over the fibers as the inserted onto the trochanter.  This was incised at the level of the distal greater trochanteric footprint.  This revealed that there was scant undersurface tear of the gluteus medius and minimus. We used a rongeur to create a bleeding bed at the insertion site of the abductor tendons after clearing the footprint of any remaining soft tissue.  Next, we placed 2 knotless 2.6 mm Arthrex medial row anchors that were double loaded fiber tape and suture tape in the gluteus medius footprint at the tip of the greater trochanter.  We then used a fast pass suture passer to pass all of the sutures in a mattress fashion through the tendon.  The 4 anterior suture line was were passed through an Arthrex 4.75 mm SwiveLock anchor.  His was placed more anteriorly distal to the footprint as a lateral row anchor.  This was repeated for the posterior 4 suture limbs allowing for a double row construct.  Repair sutures were utilized to further reduce the gluteus medius/minimus. This allowed for excellent reapproximation of the gluteus medius/minimus tendons to the footprint and it was stable with internal and external rotation of the hip.   The wound was then thoroughly irrigated.  Local anesthetic was injected.  We then closed the wound in a layered fashion with #0 vicryl in the IT band.  We closed the skin with simple interrupted inverted 2-0 Vicryl suture and then a running subcuticular suture using a 4-0 Monocryl for the skin.  Dermabond was placed to seal the wound.  Sterile dressing was  applied.  Hip abduction brace was applied.  The patient was awakened from anesthesia without difficulty and transferred to PACU in stable condition.   Of note, assistance from a PA was essential to performing the surgery.  PA was present for the entire surgery.  PA assisted with patient positioning, retraction, instrumentation, and wound closure. The surgery would have been more difficult and had longer operative time without PA assistance.   POSTOPERATIVE PLAN:  FFWB x 6 weeks. Hip abduction brace x 6 weeks. ASA '325mg'$ /day x 4 weeks for DVT ppx. Plan for discharge home. F/U in ~2 weeks.

## 2022-08-29 NOTE — Anesthesia Procedure Notes (Signed)
Procedure Name: Intubation Date/Time: 08/29/2022 11:19 AM  Performed by: Lowry Bowl, CRNAPre-anesthesia Checklist: Patient identified, Patient being monitored, Timeout performed, Emergency Drugs available and Suction available Patient Re-evaluated:Patient Re-evaluated prior to induction Oxygen Delivery Method: Circle system utilized Preoxygenation: Pre-oxygenation with 100% oxygen Induction Type: IV induction Ventilation: Mask ventilation without difficulty Laryngoscope Size: 3 and McGraph Grade View: Grade I Tube type: Oral Tube size: 6.5 mm Number of attempts: 1 Airway Equipment and Method: Stylet Placement Confirmation: ETT inserted through vocal cords under direct vision, positive ETCO2 and breath sounds checked- equal and bilateral Secured at: 21 cm Tube secured with: Tape Dental Injury: Teeth and Oropharynx as per pre-operative assessment

## 2022-08-29 NOTE — Transfer of Care (Signed)
Immediate Anesthesia Transfer of Care Note  Patient: Brittany Franklin  Procedure(s) Performed: Right endoscopic gluteus medius repair and calcific tendinitis excision with trochanteric bursectomy and IT band release (Right: Hip)  Patient Location: PACU  Anesthesia Type:General  Level of Consciousness: awake, drowsy and patient cooperative  Airway & Oxygen Therapy: Patient Spontanous Breathing and Patient connected to face mask oxygen  Post-op Assessment: Report given to RN and Post -op Vital signs reviewed and stable  Post vital signs: Reviewed and stable  Last Vitals:  Vitals Value Taken Time  BP 134/73 08/29/22 1445  Temp    Pulse 69 08/29/22 1449  Resp 12 08/29/22 1449  SpO2 100 % 08/29/22 1449  Vitals shown include unvalidated device data.  Last Pain:  Vitals:   08/29/22 1032  TempSrc: Temporal  PainSc: 0-No pain         Complications: No notable events documented.

## 2022-08-29 NOTE — Anesthesia Postprocedure Evaluation (Signed)
Anesthesia Post Note  Patient: Brittany Franklin  Procedure(s) Performed: Right endoscopic gluteus medius repair and calcific tendinitis excision with trochanteric bursectomy and IT band release (Right: Hip)  Patient location during evaluation: PACU Anesthesia Type: General Level of consciousness: awake and alert Pain management: pain level controlled Vital Signs Assessment: post-procedure vital signs reviewed and stable Respiratory status: spontaneous breathing, nonlabored ventilation, respiratory function stable and patient connected to nasal cannula oxygen Cardiovascular status: blood pressure returned to baseline and stable Postop Assessment: no apparent nausea or vomiting Anesthetic complications: no   No notable events documented.   Last Vitals:  Vitals:   08/29/22 1500 08/29/22 1516  BP: (!) 142/77 (!) 157/78  Pulse: 73 74  Resp: 16 17  Temp:    SpO2: 100% 99%    Last Pain:  Vitals:   08/29/22 1516  TempSrc:   PainSc: 3                  Arita Miss

## 2022-08-29 NOTE — Anesthesia Preprocedure Evaluation (Signed)
Anesthesia Evaluation  Patient identified by MRN, date of birth, ID band Patient awake  General Assessment Comment:  HX of PONV after laparoscopic colon resection, but no issues after other anesthetics  Reviewed: Allergy & Precautions, NPO status , Patient's Chart, lab work & pertinent test results  History of Anesthesia Complications (+) PONV and history of anesthetic complications  Airway Mallampati: I  TM Distance: >3 FB Neck ROM: Full    Dental no notable dental hx. (+) Teeth Intact, Caps   Pulmonary neg pulmonary ROS, neg sleep apnea, neg COPD, Patient abstained from smoking.Not current smoker,    Pulmonary exam normal breath sounds clear to auscultation       Cardiovascular Exercise Tolerance: Good METShypertension, (-) CAD and (-) Past MI (-) dysrhythmias  Rhythm:Regular Rate:Normal - Systolic murmurs    Neuro/Psych negative neurological ROS  negative psych ROS   GI/Hepatic hiatal hernia, GERD  Controlled and Medicated,(+)     (-) substance abuse  ,   Endo/Other  neg diabetes  Renal/GU negative Renal ROS     Musculoskeletal   Abdominal   Peds  Hematology   Anesthesia Other Findings Past Medical History: No date: Cancer [facility])     Comment:  COLON CANCER No date: Colitis No date: Colon cancer ([place]) No date: GERD (gastroesophageal reflux disease) No date: GERD without esophagitis No date: History of colon polyps No date: History of hiatal hernia     Comment:  small No date: Hyperlipidemia No date: Hypertension No date: Osteopenia No date: Osteopenia of multiple sites No date: PONV (postoperative nausea and vomiting)     Comment:  very bad nausea after colon surgery No date: Rosacea  Reproductive/Obstetrics                            Anesthesia Physical Anesthesia Plan  ASA: 2  Anesthesia Plan: General   Post-op Pain Management: Ofirmev IV (intra-op)*   Induction:  Intravenous  PONV Risk Score and Plan: 4 or greater and Ondansetron, Dexamethasone and Treatment may vary due to age or medical condition  Airway Management Planned: Oral ETT  Additional Equipment: None  Intra-op Plan:   Post-operative Plan: Extubation in OR  Informed Consent: I have reviewed the patients History and Physical, chart, labs and discussed the procedure including the risks, benefits and alternatives for the proposed anesthesia with the patient or authorized representative who has indicated his/her understanding and acceptance.     Dental advisory given  Plan Discussed with: CRNA and Surgeon  Anesthesia Plan Comments: (Discussed risks of anesthesia with patient, including PONV, sore throat, lip/dental/eye damage. Rare risks discussed as well, such as cardiorespiratory and neurological sequelae, and allergic reactions. Discussed the role of CRNA in patient's perioperative care. Patient understands.)        Anesthesia Quick Evaluation

## 2022-08-29 NOTE — Discharge Instructions (Addendum)
Gluteus Medius Repair Post-Operative Instructions   1. Plan for 1st Physical Therapy visit in ~1 week. 2. If oozing from surgery site occurs after 5-7 days postoperative, please contact Dr. Serita Grit offices. 3. Icing is very important for the first 5-7 days postoperative, and ice is applied (ice packs or ice therapy) as often as possible or at least for 20-minute periods 3-4 times per day. Ice should not be applied directly on the skin. 4. Dressing to be removed at first physical therapy visit. 5. Showering is allowed after physical therapy visit if the wound is dry.  6.  Do not soak the hip in water in a bathtub or pool for ~4 weeks.  7. Driving is permitted after ~2 weeks if the narcotic pain medication is no longer being taken and you feel comfortable getting into and out of a car. Driving a manual car may take up to 4 weeks. 8. The anesthetic drugs used during your surgery may cause nausea for the first 24 hours. If nausea is encountered, drink only clear liquids (i.e. Sprite or 7-up). The only solids should be dry crackers or toast. If nausea and vomiting become severe or the patient shows sign of dehydration (lack of urination) please call the doctor or the surgicenter. 9. If you develop a fever (101.5), redness, or yellow/brown/green drainage from the surgical incision site, please call our office to arrange for an evaluation. 10. Enclosed are prescriptions for you to use post-operatively. 11. You will take aspirin (325 mg) daily for 4 weeks. This may lower the risk of a blood clot developing after surgery. Should severe calf pain occur or significant swelling of calf and ankle, please call the doctor. 12. Local anesthetics (i.e. Novocaine) are put into the incision after surgery. It is not uncommon for patients to encounter more pain on the first or second day after surgery. This is the time when swelling peaks. Taking pain medication before bedtime will assist in sleeping. It is important not  to drink or drive while taking narcotic medication.  You should resume your normal medications for other conditions the day after surgery. 13. Follow weight bearing instructions (Flat foot weight bearing) as advised at discharge. Crutches/walker may be necessary to assist walking. Extremity elevation for the first 72 hours is also encouraged to minimize swelling. 14.  If unexpected problems occur and you need to speak to the doctor, call the office.  ACTIVITY RESTRICTIONS AND BRACING: -Brace should be worn at night while sleeping and locked in extension (0 degrees). Push orange tab down to lock in extension. If it will not push down, then the brace is probably not completely extended and may still be a bit bent.  -When up and ambulating, your brace can be unlocked from 0-90 degrees while following your touch-down weightbearing restrictions with crutches -Avoid sitting with your hips flexed 90 degrees for more than 30 minutes at a time -Avoid excessive external rotation or the "Figure-4" position with your leg crossed -We will updated your restrictions at your first postoperative visit   PAIN MEDICATION:  Tylenol '1000mg'$  3x/day until no pain 2.   Oxycodone 1 to 2 tablets by mouth every 4 hours as needed.   3.   Do NOT take anti-inflammatory medications as they can interfere with healing. These include ibuprofen (Advil, Motrin), naproxen (Aleve), meloxicam (Mobic), etodolac, diclofenac, etc.  BLOOD CLOT PREVENTION: Aspirin '325mg'$  by mouth daily for 4 weeks  ANTI-NAUSEA (if applicable): Zofran '4mg'$  tabs, 1 tablet by mouth every 6 hours as  needed. *You will be given a prescription, but it is optional to fill it.*  STOOL SOFTENER: Docusate '100mg'$  twice daily while taking pain medication   Important Contact Information [phone] (Office phone number)   Post-operative Brace: Apply and remove the brace you received as you were instructed to at the time of fitting and as described in detail as  the brace's instructions for use indicate.  Wear the brace for the period of time prescribed by your physician.  The brace can be cleaned with soap and water and allowed to air dry only.  Should the brace result in increased pain, decreased feeling (numbness/tingling), increased swelling or an overall worsening of your medical condition, please contact your doctor immediately.  If an emergency situation occurs as a result of wearing the brace after normal business hours, please dial 911 and seek immediate medical attention.  Let your doctor know if you have any further questions about the brace issued to you. Refer to the T-Scope Hip instructions for use if you have any questions regarding your hip brace.  [place] for Troubleshooting: [phone] Wrap brace around waist and attach with velcro.  Pull back support with thumb loop and adhere to waist belt.  3.   Clip on thigh strap and  tighten with strapping on front of thigh as needed.   AMBULATORY SURGERY  DISCHARGE INSTRUCTIONS   The drugs that you were given will stay in your system until tomorrow so for the next 24 hours you should not:  Drive an automobile Make any legal decisions Drink any alcoholic beverage   You may resume regular meals tomorrow.  Today it is better to start with liquids and gradually work up to solid foods.  You may eat anything you prefer, but it is better to start with liquids, then soup and crackers, and gradually work up to solid foods.   Please notify your doctor immediately if you have any unusual bleeding, trouble breathing, redness and pain at the surgery site, drainage, fever, or pain not relieved by medication.    Additional Instructions:   PLEASE LEAVE GREEN ARMBAND ON FOR FOUR DAYS    Please contact your physician with any problems or Same Day Surgery at [phone], Monday through Friday 6 am to 4 pm, or [place] at [facility] number at [phone].

## 2022-09-20 ENCOUNTER — Other Ambulatory Visit: Payer: Self-pay | Admitting: Student

## 2022-09-20 ENCOUNTER — Ambulatory Visit
Admission: RE | Admit: 2022-09-20 | Discharge: 2022-09-20 | Disposition: A | Discharge status: Home / Self Care | Payer: Medicare HMO | Source: Ambulatory Visit | Attending: Student | Admitting: Student

## 2022-09-20 ENCOUNTER — Other Ambulatory Visit
Admission: RE | Admit: 2022-09-20 | Discharge: 2022-09-20 | Disposition: A | Discharge status: Home / Self Care | Payer: Medicare HMO | Source: Ambulatory Visit | Attending: Student | Admitting: Student

## 2022-09-20 DIAGNOSIS — M7061 Trochanteric bursitis, right hip: Secondary | ICD-10-CM | POA: Insufficient documentation

## 2022-09-20 DIAGNOSIS — M76891 Other specified enthesopathies of right lower limb, excluding foot: Secondary | ICD-10-CM

## 2022-09-20 DIAGNOSIS — M25451 Effusion, right hip: Secondary | ICD-10-CM | POA: Diagnosis present

## 2022-09-20 LAB — SYNOVIAL CELL COUNT + DIFF, W/ CRYSTALS
Crystals, Fluid: NONE SEEN
Eosinophils-Synovial: 0 %
Lymphocytes-Synovial Fld: 11 %
Monocyte-Macrophage-Synovial Fluid: 25 %
Neutrophil, Synovial: 64 %
WBC, Synovial: 46515 /mm3 — ABNORMAL HIGH (ref 0–200)

## 2022-09-21 ENCOUNTER — Encounter: Payer: Self-pay | Admitting: Orthopedic Surgery

## 2022-09-21 ENCOUNTER — Other Ambulatory Visit: Payer: Self-pay | Admitting: Orthopedic Surgery

## 2022-09-22 NOTE — Anesthesia Preprocedure Evaluation (Signed)
Anesthesia Evaluation  Patient identified by MRN, date of birth, ID band Patient awake  General Assessment Comment:  HX of PONV  Reviewed: Allergy & Precautions, NPO status , Patient's Chart, lab work & pertinent test results  History of Anesthesia Complications (+) PONV and history of anesthetic complications  Airway Mallampati: I  TM Distance: >3 FB Neck ROM: Full    Dental  (+) Teeth Intact, Caps   Pulmonary neg pulmonary ROS, neg sleep apnea, neg COPD, Patient abstained from smoking.Not current smoker,    Pulmonary exam normal breath sounds clear to auscultation       Cardiovascular Exercise Tolerance: Good METShypertension, (-) CAD and (-) Past MI (-) dysrhythmias  Rhythm:Regular Rate:Normal - Systolic murmurs    Neuro/Psych negative neurological ROS  negative psych ROS   GI/Hepatic hiatal hernia, GERD  Controlled and Medicated,(+)     (-) substance abuse  ,   Endo/Other  neg diabetes  Renal/GU negative Renal ROS     Musculoskeletal   Abdominal Normal abdominal exam  (+)   Peds  Hematology   Anesthesia Other Findings Past Medical History: No date: Cancer [facility])     Comment:  COLON CANCER No date: Colitis No date: Colon cancer ([facility]) No date: GERD (gastroesophageal reflux disease) No date: GERD without esophagitis No date: History of colon polyps No date: History of hiatal hernia     Comment:  small No date: Hyperlipidemia No date: Hypertension No date: Osteopenia No date: Osteopenia of multiple sites No date: PONV (postoperative nausea and vomiting)     Comment:  very bad nausea after colon surgery No date: Rosacea  Reproductive/Obstetrics                           Anesthesia Physical  Anesthesia Plan  ASA: 2  Anesthesia Plan: General   Post-op Pain Management: Ofirmev IV (intra-op)* and Toradol IV (intra-op)   Induction: Intravenous  PONV Risk Score and Plan: 4 or  greater and Ondansetron, Dexamethasone, Treatment may vary due to age or medical condition, TIVA, Propofol infusion and Midazolam  Airway Management Planned: Oral ETT  Additional Equipment: None  Intra-op Plan:   Post-operative Plan: Extubation in OR  Informed Consent: I have reviewed the patients History and Physical, chart, labs and discussed the procedure including the risks, benefits and alternatives for the proposed anesthesia with the patient or authorized representative who has indicated his/her understanding and acceptance.     Dental advisory given  Plan Discussed with: CRNA and Surgeon  Anesthesia Plan Comments: (Discussed risks of anesthesia with patient, including PONV, sore throat, lip/dental/eye damage. Rare risks discussed as well, such as cardiorespiratory and neurological sequelae, and allergic reactions. Discussed the role of CRNA in patient's perioperative care. Patient understands.)       Anesthesia Quick Evaluation

## 2022-09-25 ENCOUNTER — Ambulatory Visit
Admission: RE | Admit: 2022-09-25 | Discharge: 2022-09-25 | Disposition: A | Discharge status: Home / Self Care | Payer: Medicare HMO | Attending: Orthopedic Surgery | Admitting: Orthopedic Surgery

## 2022-09-25 ENCOUNTER — Ambulatory Visit: Payer: Medicare HMO | Admitting: Anesthesiology

## 2022-09-25 ENCOUNTER — Other Ambulatory Visit: Payer: Self-pay

## 2022-09-25 ENCOUNTER — Encounter
Admission: RE | Disposition: A | Discharge status: Home / Self Care | Payer: Self-pay | Source: Home / Self Care | Attending: Orthopedic Surgery

## 2022-09-25 ENCOUNTER — Encounter: Payer: Self-pay | Admitting: Orthopedic Surgery

## 2022-09-25 DIAGNOSIS — L02415 Cutaneous abscess of right lower limb: Secondary | ICD-10-CM | POA: Insufficient documentation

## 2022-09-25 DIAGNOSIS — M76891 Other specified enthesopathies of right lower limb, excluding foot: Secondary | ICD-10-CM | POA: Insufficient documentation

## 2022-09-25 DIAGNOSIS — K219 Gastro-esophageal reflux disease without esophagitis: Secondary | ICD-10-CM | POA: Insufficient documentation

## 2022-09-25 DIAGNOSIS — K449 Diaphragmatic hernia without obstruction or gangrene: Secondary | ICD-10-CM | POA: Insufficient documentation

## 2022-09-25 DIAGNOSIS — M8580 Other specified disorders of bone density and structure, unspecified site: Secondary | ICD-10-CM | POA: Insufficient documentation

## 2022-09-25 DIAGNOSIS — Z9889 Other specified postprocedural states: Secondary | ICD-10-CM | POA: Insufficient documentation

## 2022-09-25 DIAGNOSIS — Z85038 Personal history of other malignant neoplasm of large intestine: Secondary | ICD-10-CM | POA: Insufficient documentation

## 2022-09-25 DIAGNOSIS — M7061 Trochanteric bursitis, right hip: Secondary | ICD-10-CM | POA: Insufficient documentation

## 2022-09-25 DIAGNOSIS — I10 Essential (primary) hypertension: Secondary | ICD-10-CM | POA: Insufficient documentation

## 2022-09-25 SURGERY — IRRIGATION AND DEBRIDEMENT POSTERIOR HIP
Anesthesia: General | Site: Hip | Laterality: Right

## 2022-09-25 MED ORDER — DROPERIDOL 2.5 MG/ML IJ SOLN: 0.6250 mg | Freq: Once | INTRAMUSCULAR | Status: DC | PRN

## 2022-09-25 MED ORDER — LACTATED RINGERS IR SOLN
Status: DC | PRN
  Administered 2022-09-25: 3000 mL

## 2022-09-25 MED ORDER — KETOROLAC TROMETHAMINE 15 MG/ML IJ SOLN
INTRAMUSCULAR | Status: DC | PRN
  Administered 2022-09-25: 30 mg via INTRAVENOUS

## 2022-09-25 MED ORDER — ONDANSETRON HCL 4 MG/2ML IJ SOLN
INTRAMUSCULAR | Status: DC | PRN
  Administered 2022-09-25: 4 mg via INTRAVENOUS

## 2022-09-25 MED ORDER — LIDOCAINE HCL (CARDIAC) PF 100 MG/5ML IV SOSY
PREFILLED_SYRINGE | INTRAVENOUS | Status: DC | PRN
  Administered 2022-09-25 (×2): 100 mg via INTRAVENOUS

## 2022-09-25 MED ORDER — CEFAZOLIN SODIUM-DEXTROSE 2-3 GM-%(50ML) IV SOLR
INTRAVENOUS | Status: DC | PRN
  Administered 2022-09-25: 2 g via INTRAVENOUS

## 2022-09-25 MED ORDER — FENTANYL CITRATE PF 50 MCG/ML IJ SOSY: 25.0000 ug | PREFILLED_SYRINGE | INTRAMUSCULAR | Status: DC | PRN

## 2022-09-25 MED ORDER — SULFAMETHOXAZOLE-TRIMETHOPRIM 800-160 MG PO TABS: 1.0000 | ORAL_TABLET | Freq: Two times a day (BID) | ORAL | 0 refills | Status: DC

## 2022-09-25 MED ORDER — OXYCODONE HCL 5 MG/5ML PO SOLN: 5.0000 mg | Freq: Once | ORAL | Status: DC | PRN

## 2022-09-25 MED ORDER — ACETAMINOPHEN 10 MG/ML IV SOLN: 1000.0000 mg | Freq: Once | INTRAVENOUS | Status: DC | PRN

## 2022-09-25 MED ORDER — OXYCODONE HCL 5 MG PO TABS: 5.0000 mg | ORAL_TABLET | Freq: Once | ORAL | Status: DC | PRN

## 2022-09-25 MED ORDER — LACTATED RINGERS IV SOLN: INTRAVENOUS | Status: DC

## 2022-09-25 MED ORDER — ACETAMINOPHEN 500 MG PO TABS
1000.0000 mg | ORAL_TABLET | Freq: Once | ORAL | Status: AC
  Administered 2022-09-25: 1000 mg via ORAL

## 2022-09-25 MED ORDER — EPHEDRINE SULFATE (PRESSORS) 50 MG/ML IJ SOLN
INTRAMUSCULAR | Status: DC | PRN
  Administered 2022-09-25 (×3): 5 mg via INTRAVENOUS
  Administered 2022-09-25: 10 mg via INTRAVENOUS

## 2022-09-25 MED ORDER — FENTANYL CITRATE (PF) 100 MCG/2ML IJ SOLN
INTRAMUSCULAR | Status: DC | PRN
  Administered 2022-09-25 (×2): 50 ug via INTRAVENOUS
  Administered 2022-09-25: 100 ug via INTRAVENOUS

## 2022-09-25 MED ORDER — PROMETHAZINE HCL 25 MG/ML IJ SOLN: 6.2500 mg | INTRAMUSCULAR | Status: DC | PRN

## 2022-09-25 MED ORDER — PROPOFOL 10 MG/ML IV BOLUS
INTRAVENOUS | Status: DC | PRN
  Administered 2022-09-25: 100 mg via INTRAVENOUS
  Administered 2022-09-25: 200 ug/kg/min via INTRAVENOUS
  Administered 2022-09-25: 100 mg via INTRAVENOUS

## 2022-09-25 MED ORDER — DEXAMETHASONE SODIUM PHOSPHATE 4 MG/ML IJ SOLN
INTRAMUSCULAR | Status: DC | PRN
  Administered 2022-09-25: 4 mg via INTRAVENOUS

## 2022-09-25 MED ORDER — SUCCINYLCHOLINE CHLORIDE 200 MG/10ML IV SOSY
PREFILLED_SYRINGE | INTRAVENOUS | Status: DC | PRN
  Administered 2022-09-25: 80 mg via INTRAVENOUS

## 2022-09-25 MED ORDER — MIDAZOLAM HCL 5 MG/5ML IJ SOLN
INTRAMUSCULAR | Status: DC | PRN
  Administered 2022-09-25: 2 mg via INTRAVENOUS

## 2022-09-25 SURGICAL SUPPLY — 33 items
APL PRP STRL LF DISP 70% ISPRP (MISCELLANEOUS) ×2
BLADE SURG SZ10 CARB STEEL (BLADE) IMPLANT
CHLORAPREP W/TINT 26 (MISCELLANEOUS) IMPLANT
Cohesive Bandage 6" Tan IMPLANT
DRAPE HEAD BAR (DRAPES) IMPLANT
DRAPE IMP U-DRAPE 54X76 (DRAPES) ×1 IMPLANT
DRAPE INCISE IOBAN 66X45 STRL (DRAPES) IMPLANT
DRSG OPSITE POSTOP 4X8 (GAUZE/BANDAGES/DRESSINGS) ×1 IMPLANT
DRSG TEGADERM 4X4.75 (GAUZE/BANDAGES/DRESSINGS) IMPLANT
ELECT REM PT RETURN 9FT ADLT (ELECTROSURGICAL) ×1
ELECTRODE REM PT RTRN 9FT ADLT (ELECTROSURGICAL) ×1 IMPLANT
GAUZE SPONGE 4X4 12PLY STRL (GAUZE/BANDAGES/DRESSINGS) IMPLANT
GAUZE XEROFORM 1X8 LF (GAUZE/BANDAGES/DRESSINGS) ×1 IMPLANT
GLOVE SRG 8 PF TXTR STRL LF DI (GLOVE) ×1 IMPLANT
GLOVE SURG ENC MOIS LTX SZ7.5 (GLOVE) ×2 IMPLANT
GLOVE SURG UNDER POLY LF SZ8 (GLOVE) ×1
GOWN STRL REIN 2XL XLG LVL4 (GOWN DISPOSABLE) ×1 IMPLANT
KIT TURNOVER KIT A (KITS) ×1 IMPLANT
MANIFOLD 4PT FOR NEPTUNE1 (MISCELLANEOUS) IMPLANT
NDL HYPO 21X1.5 SAFETY (NEEDLE) ×1 IMPLANT
NEEDLE HYPO 21X1.5 SAFETY (NEEDLE) ×1 IMPLANT
NS IRRIG 500ML POUR BTL (IV SOLUTION) ×1 IMPLANT
PACK EXTREMITY (MISCELLANEOUS) IMPLANT
PAD ABD DERMACEA PRESS 5X9 (GAUZE/BANDAGES/DRESSINGS) IMPLANT
PULSAVAC PLUS IRRIG FAN TIP (DISPOSABLE) ×1
SPONGE T-LAP 18X18 ~~LOC~~+RFID (SPONGE) IMPLANT
SUT ETHILON 3-0 FS-10 30 BLK (SUTURE) ×1
SUT PDS 2-0 27IN (SUTURE) IMPLANT
SUT PDS AB 0 CT1 27 (SUTURE) IMPLANT
SUTURE EHLN 3-0 FS-10 30 BLK (SUTURE) IMPLANT
SYR 10ML LL (SYRINGE) ×1 IMPLANT
Stockinette Convertors IMPLANT
TIP FAN IRRIG PULSAVAC PLUS (DISPOSABLE) IMPLANT

## 2022-09-25 NOTE — Transfer of Care (Signed)
Immediate Anesthesia Transfer of Care Note  Patient: Brittany Franklin  Procedure(s) Performed: Right hip irrigation and debridement (Right: Hip)  Patient Location: PACU  Anesthesia Type: General  Level of Consciousness: awake, alert  and patient cooperative  Airway and Oxygen Therapy: Patient Spontanous Breathing and Patient connected to supplemental oxygen  Post-op Assessment: Post-op Vital signs reviewed, Patient's Cardiovascular Status Stable, Respiratory Function Stable, Patent Airway and No signs of Nausea or vomiting  Post-op Vital Signs: Reviewed and stable  Complications: There were no known notable events for this encounter.

## 2022-09-25 NOTE — Anesthesia Postprocedure Evaluation (Signed)
Anesthesia Post Note  Patient: Brittany Franklin  Procedure(s) Performed: Right hip irrigation and debridement (Right: Hip)  Patient location during evaluation: PACU Anesthesia Type: General Level of consciousness: awake and alert Pain management: pain level controlled Vital Signs Assessment: post-procedure vital signs reviewed and stable Respiratory status: spontaneous breathing, nonlabored ventilation and respiratory function stable Cardiovascular status: blood pressure returned to baseline and stable Postop Assessment: no apparent nausea or vomiting Anesthetic complications: no   There were no known notable events for this encounter.   Last Vitals:  Vitals:   09/25/22 1430 09/25/22 1445  BP: (!) 145/67 (!) 134/58  Pulse: 85 82  Resp: 12 14  Temp:  (!) 36.2 C  SpO2: 100% 98%    Last Pain:  Vitals:   09/25/22 1445  TempSrc:   PainSc: [place]

## 2022-09-25 NOTE — Discharge Instructions (Signed)
Discharge Instructions - Hip Surgery   Post-Op Instructions   1. Bracing/crutches/walker - Use crutches or walker as needed until able to wean off with physical therapist.   2. Ice: You should ice the area of surgery ~20-30 minutes on with a similar time without ice. Do this for the first week at least to help with pain and swelling.    3. Driving:  Plan on not driving for at least two weeks. Please note that you are advised NOT to drive while taking narcotic pain medications as you may be impaired and unsafe to drive.   4. Activity: Ankle pumps several times an hour while awake to prevent blood clots. Weight bearing: FLAT FOOT (WEIGHT OF LEG ONLY) WEIGHT BEARING. Use crutches or walker for a total of 6 weeks. Avoid standing more than 5-10 minutes (consecutively) for the first week.  Avoid long distance travel for 2 weeks.  5. Medications:  - You have been provided a prescription for narcotic pain medicine. After surgery, take 1-2 narcotic tablets every 4 hours if needed for severe pain.  - You may take up to '3000mg'$ /day of tylenol (acetaminophen). You can take '1000mg'$  3x/day. Please check your narcotic. If you have acetaminophen in your narcotic (each tablet will be '325mg'$ ), be careful not to exceed a total of '3000mg'$ /day of acetaminophen.  - A prescription for anti-nausea medication will be provided in case the narcotic medicine or anesthesia causes nausea - take 1 tablet every 6 hours only if nauseated.  - Take enteric coated aspirin 325 mg once daily for 4 weeks to prevent blood clots.  - Continue to take antibiotic (Bactrim DS -1 tablet twice daily) x 2 weeks postoperatively    6. Bandages: Leave on until first postoperative appointment in ~2 weeks     7. Post-Op Appointments: Your first post-op appointment will be with Dr. Posey Pronto in approximately 2 weeks time.     If you find that they have not been scheduled please call the Orthopaedic Appointment front desk at [phone].

## 2022-09-25 NOTE — H&P (Signed)
Paper H&P to be scanned into permanent record. H&P reviewed. No significant changes noted.  

## 2022-09-25 NOTE — Op Note (Signed)
Operative Note    SURGERY DATE: 09/25/2022   PRE-OP DIAGNOSIS:  1. Right hip infection   POST-OP DIAGNOSIS:  1. Right hip infection   PROCEDURES:  1. Right hip incision and drainage of deep abscess 2. Right hip excisional debridement of muscle and bone   SURGEON: Cato Mulligan, MD  ASSISTANTS: None    ANESTHESIA: Gen    ESTIMATED BLOOD LOSS: 50cc   TOTAL IV FLUIDS: per anesthesia  INDICATION(S):  Brittany Franklin is a 71 y.o. female who underwent Right hip endoscopic loose body removal, open gluteus medius repair, and trochanteric bursectomy by me on 08/29/2022.  She was doing well at her postoperative appointment 2 weeks after surgery, but a few days later noted purulent type drainage coming from the distal aspect of her incision.  Given this finding, we agreed to proceed with above procedures.  MRI showed a large fluid collection consistent with infection superficial to the IT band.  Deep to the IT band, there was also a fluid collection, but this appeared to be more of a postoperative seroma.  Culture samples grew out staph epidermis that was essentially pansensitive.  She had been on Bactrim DS p.o. for the prior 5 days.  After discussion of risks, benefits, and alternatives to surgery, the patient elected to proceed with above procedures.   OPERATIVE FINDINGS: Grossly purulent material superficial to the IT band.  Deep to the IT band, there appeared to be a postoperative seroma.  Hip abductor repair appeared to be intact with early signs of appropriate healing.   OPERATIVE REPORT:   I identified Alejah A Clendenen in the pre-operative holding area. Informed consent was obtained and the surgical site was marked. I reviewed the risks and benefits of the proposed surgical intervention and the patient wished to proceed. The patient was transferred to the operative suite and general anesthesia was administered. The patient was placed in the lateral decubitus position on a beanbag.  All down side pressure points were appropriately padded. The extremity was then prepped and draped in standard fashion. A time out was performed confirming the correct extremity, correct patient, and correct procedure.  Preoperative IV antibiotics were administered given current orders were already growing pansensitive Staph epidermidis.  We utilized the previously made incision.  Dissection was carried down through the subcutaneous tissue.  There was immediate return of purulent fluid.  Culture sample of this superficial fluid collection was obtained.  Loose tissue was debrided using a curette and rongeur.  Culture sample of this was also sent as a second superficial sample.  Mechanical debridement was also performed utilizing lap sponges.  The wound was thoroughly irrigated.  The IT band appeared to be intact.  Given the deep fluid collection on MRI, the IT band was incised to ensure that this was not infectious as well.   IT band was incised.  Serosanguineous fluid was present posteriolaterally to the greater trochanter, which was consistent with preoperative imaging.  This fluid did not appear to be infectious, but a deep culture sample of this was also taken.  The hip abductor repair appeared to be intact and showed good signs of healing.  A curette was also used to debride the gluteal muscle/fascia as well as the bone to ensure that there was no sign of infection in this region, which there did not appear to be.  Loose muscle/fascial tissue was removed with a rongeur.  The wound was thoroughly irrigated with pulse lavage.  The IT band was closed with  0-PDS.  This was followed by dead space closure with 0 PDS as well.  The subdermal layer was closed with 2-0 PDS.  The skin was then closed with 3-0 nylon in a horizontal mattress fashion.  POSTOPERATIVE PLAN: The patient will continue flatfoot weightbearing restrictions for a total of 6 weeks from her index surgery.  Continue Bactrim DS p.o. x 2 weeks.  We  will plan to follow-up the cultures.  ASA 325 mg daily for DVT prophylaxis.  Plan to resume physical therapy after her first postoperative visit with me in approximately 2 weeks.

## 2022-09-25 NOTE — Anesthesia Procedure Notes (Signed)
Procedure Name: Intubation Date/Time: 09/25/2022 12:51 PM  Performed by: Patience Musca., CRNAPre-anesthesia Checklist: Patient identified, Patient being monitored, Timeout performed, Emergency Drugs available and Suction available Patient Re-evaluated:Patient Re-evaluated prior to induction Oxygen Delivery Method: Circle system utilized Preoxygenation: Pre-oxygenation with 100% oxygen Induction Type: IV induction Ventilation: Mask ventilation without difficulty Laryngoscope Size: Mac and 3 Grade View: Grade I Tube type: Oral Tube size: 7.0 mm Number of attempts: 1 Airway Equipment and Method: Stylet Placement Confirmation: ETT inserted through vocal cords under direct vision, positive ETCO2 and breath sounds checked- equal and bilateral Secured at: 22 cm Tube secured with: Tape Dental Injury: Teeth and Oropharynx as per pre-operative assessment

## 2022-09-28 NOTE — Progress Notes (Signed)
Entire length of hip wound ~12cm was debrided

## 2022-09-30 LAB — AEROBIC/ANAEROBIC CULTURE W GRAM STAIN (SURGICAL/DEEP WOUND): Gram Stain: NONE SEEN

## 2022-10-01 LAB — AEROBIC/ANAEROBIC CULTURE W GRAM STAIN (SURGICAL/DEEP WOUND): Gram Stain: NONE SEEN

## 2023-05-22 ENCOUNTER — Other Ambulatory Visit: Payer: Self-pay | Admitting: Family Medicine

## 2023-05-22 DIAGNOSIS — Z1231 Encounter for screening mammogram for malignant neoplasm of breast: Secondary | ICD-10-CM

## 2023-09-13 ENCOUNTER — Ambulatory Visit
Admission: RE | Admit: 2023-09-13 | Discharge: 2023-09-13 | Disposition: A | Discharge status: Home / Self Care | Payer: Medicare HMO | Source: Ambulatory Visit | Attending: Family Medicine | Admitting: Family Medicine

## 2023-09-13 DIAGNOSIS — Z1231 Encounter for screening mammogram for malignant neoplasm of breast: Secondary | ICD-10-CM | POA: Diagnosis present

## 2023-10-19 ENCOUNTER — Other Ambulatory Visit: Payer: Self-pay | Admitting: Sports Medicine

## 2023-10-19 DIAGNOSIS — M25552 Pain in left hip: Secondary | ICD-10-CM

## 2023-10-19 DIAGNOSIS — M76892 Other specified enthesopathies of left lower limb, excluding foot: Secondary | ICD-10-CM

## 2023-10-19 DIAGNOSIS — M7062 Trochanteric bursitis, left hip: Secondary | ICD-10-CM

## 2023-10-19 DIAGNOSIS — M1612 Unilateral primary osteoarthritis, left hip: Secondary | ICD-10-CM

## 2023-11-10 ENCOUNTER — Ambulatory Visit
Admission: RE | Admit: 2023-11-10 | Discharge: 2023-11-10 | Disposition: A | Discharge status: Home / Self Care | Payer: Medicare HMO | Source: Ambulatory Visit | Attending: Sports Medicine | Admitting: Sports Medicine

## 2023-11-10 DIAGNOSIS — M76892 Other specified enthesopathies of left lower limb, excluding foot: Secondary | ICD-10-CM

## 2023-11-10 DIAGNOSIS — M1612 Unilateral primary osteoarthritis, left hip: Secondary | ICD-10-CM

## 2023-11-10 DIAGNOSIS — M25552 Pain in left hip: Secondary | ICD-10-CM

## 2023-11-10 DIAGNOSIS — M7062 Trochanteric bursitis, left hip: Secondary | ICD-10-CM

## 2024-01-14 ENCOUNTER — Other Ambulatory Visit: Payer: Self-pay | Admitting: Orthopedic Surgery

## 2024-01-21 ENCOUNTER — Encounter
Admission: RE | Admit: 2024-01-21 | Discharge: 2024-01-21 | Disposition: A | Discharge status: Home / Self Care | Payer: Medicare HMO | Source: Ambulatory Visit | Attending: Orthopedic Surgery | Admitting: Orthopedic Surgery

## 2024-01-21 ENCOUNTER — Other Ambulatory Visit: Payer: Self-pay

## 2024-01-21 VITALS — Ht 67.0 in | Wt 159.8 lb

## 2024-01-21 DIAGNOSIS — I10 Essential (primary) hypertension: Secondary | ICD-10-CM

## 2024-01-21 HISTORY — DX: Anesthesia of skin: R20.2

## 2024-01-21 HISTORY — DX: Unspecified osteoarthritis, unspecified site: M19.90

## 2024-01-21 HISTORY — DX: Anesthesia of skin: R20.0

## 2024-01-21 NOTE — Patient Instructions (Addendum)
 Your procedure is scheduled on: Tuesday 01/29/2024 Report to the Registration Desk on the 1st floor of the [facility]. To find out your arrival time, please call [phone] between 1PM - 3PM on: Monday 01/28/2024 If your arrival time is 6:00 am, do not arrive before that time as the [facility] entrance doors do not open until 6:00 am.  REMEMBER: Instructions that are not followed completely may result in serious medical risk, up to and including death; or upon the discretion of your surgeon and anesthesiologist your surgery may need to be rescheduled.  Do not eat food after midnight the night before surgery.  No gum chewing or hard candies.  You may however, drink CLEAR liquids up to 2 hours before you are scheduled to arrive for your surgery. Do not drink anything within 2 hours of your scheduled arrival time.  Clear liquids include: - water  - apple juice without pulp - black coffee or tea (Do NOT add milk or creamers to the coffee or tea) Do NOT drink anything that is not on this list.   In addition, your doctor has ordered for you to drink the provided:  Ensure Pre-Surgery Clear Carbohydrate Drink  Drinking this carbohydrate drink up to two hours before surgery helps to reduce insulin resistance and improve patient outcomes. Please complete drinking 2 hours before scheduled arrival time.  One week prior to surgery: Stop Anti-inflammatories (NSAIDS) such as Advil, Aleve, Ibuprofen, Motrin, Naproxen, Naprosyn and Aspirin based products such as Excedrin, Goody's Powder, BC Powder. Stop ANY OVER THE COUNTER supplements until after surgery.  You may however, continue to take Tylenol if needed for pain up until the day of surgery.   Continue taking all of your other prescription medications up until the day of surgery.  ON THE DAY OF SURGERY ONLY TAKE THESE MEDICATIONS WITH SIPS OF WATER:  omeprazole (PRILOSEC) 20 MG  solifenacin (VESICARE) 5 MG    No Alcohol for 24 hours  before or after surgery.  No Smoking including e-cigarettes for 24 hours before surgery.  No chewable tobacco products for at least 6 hours before surgery.  No nicotine patches on the day of surgery.  Do not use any "recreational" drugs for at least a week (preferably 2 weeks) before your surgery.  Please be advised that the combination of cocaine and anesthesia may have negative outcomes, up to and including death. If you test positive for cocaine, your surgery will be cancelled.  On the morning of surgery brush your teeth with toothpaste and water, you may rinse your mouth with mouthwash if you wish. Do not swallow any toothpaste or mouthwash.  Use CHG Soap or wipes as directed on instruction sheet.  Do not wear jewelry, make-up, hairpins, clips or nail polish.  For welded (permanent) jewelry: bracelets, anklets, waist bands, etc.  Please have this removed prior to surgery.  If it is not removed, there is a chance that hospital personnel will need to cut it off on the day of surgery.  Do not wear lotions, powders, or perfumes.   Do not shave body hair from the neck down 48 hours before surgery.  Contact lenses, hearing aids and dentures may not be worn into surgery.  Do not bring valuables to the hospital. [facility] is not responsible for any missing/lost belongings or valuables.    Notify your doctor if there is any change in your medical condition (cold, fever, infection).  Wear comfortable clothing (specific to your surgery type) to the  hospital.  After surgery, you can help prevent lung complications by doing breathing exercises.  Take deep breaths and cough every 1-2 hours. Your doctor may order a device called an Incentive Spirometer to help you take deep breaths. When coughing or sneezing, hold a pillow firmly against your incision with both hands. This is called "splinting." Doing this helps protect your incision. It also decreases belly discomfort.  If you are being  admitted to the hospital overnight, leave your suitcase in the car. After surgery it may be brought to your room.  In case of increased patient census, it may be necessary for you, the patient, to continue your postoperative care in the Same Day Surgery department.  If you are being discharged the day of surgery, you will not be allowed to drive home. You will need a responsible individual to drive you home and stay with you for 24 hours after surgery.   If you are taking public transportation, you will need to have a responsible individual with you.  Please call the Pre-admissions Testing Dept. at [phone] if you have any questions about these instructions.  Surgery Visitation Policy:  Patients having surgery or a procedure may have two visitors.  Children under the age of 74 must have an adult with them who is not the patient.  Temporary Visitor Restrictions Due to increasing cases of flu, RSV and COVID-19: Children ages 74 and under will not be able to visit patients in [facility] hospitals under most circumstances.  Inpatient Visitation:    Visiting hours are 7 a.m. to 8 p.m. Up to four visitors are allowed at one time in a patient room. The visitors may rotate out with other people during the day.  One visitor age 65 or older may stay with the patient overnight and must be in the room by 8 p.m.     Preparing for Surgery with CHLORHEXIDINE GLUCONATE (CHG) Soap  Chlorhexidine Gluconate (CHG) Soap  o An antiseptic cleaner that kills germs and bonds with the skin to continue killing germs even after washing  o Used for showering the night before surgery and morning of surgery  Before surgery, you can play an important role by reducing the number of germs on your skin.  CHG (Chlorhexidine gluconate) soap is an antiseptic cleanser which kills germs and bonds with the skin to continue killing germs even after washing.  Please do not use if you have an allergy to CHG or  antibacterial soaps. If your skin becomes reddened/irritated stop using the CHG.  1. Shower the NIGHT BEFORE SURGERY and the MORNING OF SURGERY with CHG soap.  2. If you choose to wash your hair, wash your hair first as usual with your normal shampoo.  3. After shampooing, rinse your hair and body thoroughly to remove the shampoo.  4. Use CHG as you would any other liquid soap. You can apply CHG directly to the skin and wash gently with a scrungie or a clean washcloth.  5. Apply the CHG soap to your body only from the neck down. Do not use on open wounds or open sores. Avoid contact with your eyes, ears, mouth, and genitals (private parts). Wash face and genitals (private parts) with your normal soap.  6. Wash thoroughly, paying special attention to the area where your surgery will be performed.  7. Thoroughly rinse your body with warm water.  8. Do not shower/wash with your normal soap after using and rinsing off the CHG soap.  9.  Pat yourself dry with a clean towel.  10. Wear clean pajamas to bed the night before surgery.  12. Place clean sheets on your bed the night of your first shower and do not sleep with pets.  13. Shower again with the CHG soap on the day of surgery prior to arriving at the hospital.  14. Do not apply any deodorants/lotions/powders.  15. Please wear clean clothes to the hospital.  How to Use an Incentive Spirometer  An incentive spirometer is a tool that measures how well you are filling your lungs with each breath. Learning to take long, deep breaths using this tool can help you keep your lungs clear and active. This may help to reverse or lessen your chance of developing breathing (pulmonary) problems, especially infection. You may be asked to use a spirometer: After a surgery. If you have a lung problem or a history of smoking. After a long period of time when you have been unable to move or be active. If the spirometer includes an indicator to show the  highest number that you have reached, your health care provider or respiratory therapist will help you set a goal. Keep a log of your progress as told by your health care provider. What are the risks? Breathing too quickly may cause dizziness or cause you to pass out. Take your time so you do not get dizzy or light-headed. If you are in pain, you may need to take pain medicine before doing incentive spirometry. It is harder to take a deep breath if you are having pain. How to use your incentive spirometer  Sit up on the edge of your bed or on a chair. Hold the incentive spirometer so that it is in an upright position. Before you use the spirometer, breathe out normally. Place the mouthpiece in your mouth. Make sure your lips are closed tightly around it. Breathe in slowly and as deeply as you can through your mouth, causing the piston or the ball to rise toward the top of the chamber. Hold your breath for 3-5 seconds, or for as long as possible. If the spirometer includes a coach indicator, use this to guide you in breathing. Slow down your breathing if the indicator goes above the marked areas. Remove the mouthpiece from your mouth and breathe out normally. The piston or ball will return to the bottom of the chamber. Rest for a few seconds, then repeat the steps 10 or more times. Take your time and take a few normal breaths between deep breaths so that you do not get dizzy or light-headed. Do this every 1-2 hours when you are awake. If the spirometer includes a goal marker to show the highest number you have reached (best effort), use this as a goal to work toward during each repetition. After each set of 10 deep breaths, cough a few times. This will help to make sure that your lungs are clear. If you have an incision on your chest or abdomen from surgery, place a pillow or a rolled-up towel firmly against the incision when you cough. This can help to reduce pain while taking deep breaths and  coughing. General tips When you are able to get out of bed: Walk around often. Continue to take deep breaths and cough in order to clear your lungs. Keep using the incentive spirometer until your health care provider says it is okay to stop using it. If you have been in the hospital, you may be told to keep using  the spirometer at home. Contact a health care provider if: You are having difficulty using the spirometer. You have trouble using the spirometer as often as instructed. Your pain medicine is not giving enough relief for you to use the spirometer as told. You have a fever. Get help right away if: You develop shortness of breath. You develop a cough with bloody mucus from the lungs. You have fluid or blood coming from an incision site after you cough. Summary An incentive spirometer is a tool that can help you learn to take long, deep breaths to keep your lungs clear and active. You may be asked to use a spirometer after a surgery, if you have a lung problem or a history of smoking, or if you have been inactive for a long period of time. Use your incentive spirometer as instructed every 1-2 hours while you are awake. If you have an incision on your chest or abdomen, place a pillow or a rolled-up towel firmly against your incision when you cough. This will help to reduce pain. Get help right away if you have shortness of breath, you cough up bloody mucus, or blood comes from your incision when you cough. This information is not intended to replace advice given to you by your health care provider. Make sure you discuss any questions you have with your health care provider. Document Revised: 02/09/2020 Document Reviewed: 02/09/2020 Elsevier Patient Education  2023 [employer].

## 2024-01-24 ENCOUNTER — Encounter
Admission: RE | Admit: 2024-01-24 | Discharge: 2024-01-24 | Disposition: A | Discharge status: Home / Self Care | Payer: Medicare HMO | Source: Ambulatory Visit | Attending: Orthopedic Surgery | Admitting: Orthopedic Surgery

## 2024-01-24 DIAGNOSIS — Z01818 Encounter for other preprocedural examination: Secondary | ICD-10-CM | POA: Insufficient documentation

## 2024-01-24 DIAGNOSIS — I10 Essential (primary) hypertension: Secondary | ICD-10-CM

## 2024-01-24 DIAGNOSIS — Z01810 Encounter for preprocedural cardiovascular examination: Secondary | ICD-10-CM | POA: Diagnosis not present

## 2024-01-24 LAB — CBC
HCT: 39.4 % (ref 36.0–46.0)
Hemoglobin: 12.7 g/dL (ref 12.0–15.0)
MCH: 29.3 pg (ref 26.0–34.0)
MCHC: 32.2 g/dL (ref 30.0–36.0)
MCV: 91 fL (ref 80.0–100.0)
Platelets: 385 10*3/uL (ref 150–400)
RBC: 4.33 MIL/uL (ref 3.87–5.11)
RDW: 13 % (ref 11.5–15.5)
WBC: 6.8 10*3/uL (ref 4.0–10.5)
nRBC: 0 % (ref 0.0–0.2)

## 2024-01-29 ENCOUNTER — Encounter: Payer: Self-pay | Admitting: Orthopedic Surgery

## 2024-01-29 ENCOUNTER — Ambulatory Visit
Admission: RE | Admit: 2024-01-29 | Discharge: 2024-01-29 | Disposition: A | Discharge status: Home / Self Care | Payer: Medicare HMO | Attending: Orthopedic Surgery | Admitting: Orthopedic Surgery

## 2024-01-29 ENCOUNTER — Other Ambulatory Visit: Payer: Self-pay

## 2024-01-29 ENCOUNTER — Ambulatory Visit: Payer: Medicare HMO | Admitting: Urgent Care

## 2024-01-29 ENCOUNTER — Encounter
Admission: RE | Disposition: A | Discharge status: Home / Self Care | Payer: Self-pay | Source: Home / Self Care | Attending: Orthopedic Surgery

## 2024-01-29 ENCOUNTER — Ambulatory Visit: Payer: Medicare HMO

## 2024-01-29 DIAGNOSIS — I10 Essential (primary) hypertension: Secondary | ICD-10-CM | POA: Diagnosis not present

## 2024-01-29 DIAGNOSIS — X58XXXA Exposure to other specified factors, initial encounter: Secondary | ICD-10-CM | POA: Insufficient documentation

## 2024-01-29 DIAGNOSIS — K219 Gastro-esophageal reflux disease without esophagitis: Secondary | ICD-10-CM | POA: Insufficient documentation

## 2024-01-29 DIAGNOSIS — Z85038 Personal history of other malignant neoplasm of large intestine: Secondary | ICD-10-CM | POA: Diagnosis not present

## 2024-01-29 DIAGNOSIS — G629 Polyneuropathy, unspecified: Secondary | ICD-10-CM | POA: Diagnosis not present

## 2024-01-29 DIAGNOSIS — K449 Diaphragmatic hernia without obstruction or gangrene: Secondary | ICD-10-CM | POA: Insufficient documentation

## 2024-01-29 DIAGNOSIS — S76012A Strain of muscle, fascia and tendon of left hip, initial encounter: Secondary | ICD-10-CM | POA: Diagnosis not present

## 2024-01-29 DIAGNOSIS — M7062 Trochanteric bursitis, left hip: Secondary | ICD-10-CM | POA: Insufficient documentation

## 2024-01-29 DIAGNOSIS — M1990 Unspecified osteoarthritis, unspecified site: Secondary | ICD-10-CM | POA: Diagnosis not present

## 2024-01-29 SURGERY — ARTHROSCOPIC TROCHANTERIC BURSECTOMY
Anesthesia: General | Site: Hip | Laterality: Left

## 2024-01-29 MED ORDER — ASPIRIN 325 MG PO TBEC
325.0000 mg | DELAYED_RELEASE_TABLET | Freq: Every day | ORAL | 0 refills | Status: AC
Start: 2024-01-29 — End: 2024-02-26

## 2024-01-29 MED ORDER — ACETAMINOPHEN 10 MG/ML IV SOLN
INTRAVENOUS | Status: AC
  Filled 2024-01-29: qty 100

## 2024-01-29 MED ORDER — SODIUM CHLORIDE 0.9 % IR SOLN
Status: DC | PRN
  Administered 2024-01-29: 500 mL

## 2024-01-29 MED ORDER — LIDOCAINE-EPINEPHRINE 1 %-1:100000 IJ SOLN
INTRAMUSCULAR | Status: AC
  Filled 2024-01-29: qty 1

## 2024-01-29 MED ORDER — MIDAZOLAM HCL 2 MG/2ML IJ SOLN
INTRAMUSCULAR | Status: AC
  Filled 2024-01-29: qty 2

## 2024-01-29 MED ORDER — FENTANYL CITRATE (PF) 100 MCG/2ML IJ SOLN
INTRAMUSCULAR | Status: AC
  Filled 2024-01-29: qty 2

## 2024-01-29 MED ORDER — ROCURONIUM BROMIDE 100 MG/10ML IV SOLN
INTRAVENOUS | Status: DC | PRN
  Administered 2024-01-29: 50 mg via INTRAVENOUS
  Administered 2024-01-29: 20 mg via INTRAVENOUS

## 2024-01-29 MED ORDER — SUGAMMADEX SODIUM 200 MG/2ML IV SOLN
INTRAVENOUS | Status: AC
  Filled 2024-01-29: qty 2

## 2024-01-29 MED ORDER — KETOROLAC TROMETHAMINE 30 MG/ML IJ SOLN
INTRAMUSCULAR | Status: DC | PRN
  Administered 2024-01-29: 30 mg via INTRAVENOUS

## 2024-01-29 MED ORDER — ROCURONIUM BROMIDE 10 MG/ML (PF) SYRINGE
PREFILLED_SYRINGE | INTRAVENOUS | Status: AC
  Filled 2024-01-29: qty 10

## 2024-01-29 MED ORDER — BUPIVACAINE LIPOSOME 1.3 % IJ SUSP
INTRAMUSCULAR | Status: AC
  Filled 2024-01-29: qty 20

## 2024-01-29 MED ORDER — SUGAMMADEX SODIUM 200 MG/2ML IV SOLN
INTRAVENOUS | Status: DC | PRN
  Administered 2024-01-29: 200 mg via INTRAVENOUS

## 2024-01-29 MED ORDER — DEXAMETHASONE SODIUM PHOSPHATE 10 MG/ML IJ SOLN
INTRAMUSCULAR | Status: DC | PRN
  Administered 2024-01-29: 10 mg via INTRAVENOUS

## 2024-01-29 MED ORDER — ORAL CARE MOUTH RINSE: 15.0000 mL | Freq: Once | OROMUCOSAL | Status: AC

## 2024-01-29 MED ORDER — CHLORHEXIDINE GLUCONATE 0.12 % MT SOLN
OROMUCOSAL | Status: AC
  Filled 2024-01-29: qty 15

## 2024-01-29 MED ORDER — CEFAZOLIN SODIUM-DEXTROSE 2-4 GM/100ML-% IV SOLN
INTRAVENOUS | Status: AC
  Filled 2024-01-29: qty 100

## 2024-01-29 MED ORDER — MIDAZOLAM HCL 2 MG/2ML IJ SOLN
INTRAMUSCULAR | Status: DC | PRN
  Administered 2024-01-29: 2 mg via INTRAVENOUS

## 2024-01-29 MED ORDER — ACETAMINOPHEN 500 MG PO TABS: 1000.0000 mg | ORAL_TABLET | Freq: Three times a day (TID) | ORAL | 2 refills | Status: AC

## 2024-01-29 MED ORDER — EPHEDRINE 5 MG/ML INJ
INTRAVENOUS | Status: AC
  Filled 2024-01-29: qty 5

## 2024-01-29 MED ORDER — BUPIVACAINE HCL (PF) 0.5 % IJ SOLN
INTRAMUSCULAR | Status: AC
  Filled 2024-01-29: qty 30

## 2024-01-29 MED ORDER — PHENYLEPHRINE HCL-NACL 20-0.9 MG/250ML-% IV SOLN
INTRAVENOUS | Status: DC | PRN
Start: 2024-01-29 — End: 2024-01-29
  Administered 2024-01-29: 20 ug/min via INTRAVENOUS

## 2024-01-29 MED ORDER — BUPIVACAINE HCL 0.5 % IJ SOLN
INTRAMUSCULAR | Status: DC | PRN
  Administered 2024-01-29: 40 mL via SURGICAL_CAVITY

## 2024-01-29 MED ORDER — FENTANYL CITRATE (PF) 100 MCG/2ML IJ SOLN
INTRAMUSCULAR | Status: DC | PRN
  Administered 2024-01-29 (×2): 50 ug via INTRAVENOUS

## 2024-01-29 MED ORDER — CEFAZOLIN SODIUM-DEXTROSE 2-4 GM/100ML-% IV SOLN
2.0000 g | INTRAVENOUS | Status: AC
  Administered 2024-01-29: 2 g via INTRAVENOUS

## 2024-01-29 MED ORDER — SCOPOLAMINE 1 MG/3DAYS TD PT72
MEDICATED_PATCH | TRANSDERMAL | Status: AC
  Filled 2024-01-29: qty 1

## 2024-01-29 MED ORDER — LACTATED RINGERS IV SOLN: INTRAVENOUS | Status: DC

## 2024-01-29 MED ORDER — ACETAMINOPHEN 10 MG/ML IV SOLN
INTRAVENOUS | Status: DC | PRN
  Administered 2024-01-29: 1000 mg via INTRAVENOUS

## 2024-01-29 MED ORDER — DEXAMETHASONE SODIUM PHOSPHATE 10 MG/ML IJ SOLN
INTRAMUSCULAR | Status: AC
  Filled 2024-01-29: qty 1

## 2024-01-29 MED ORDER — ONDANSETRON HCL 4 MG/2ML IJ SOLN
INTRAMUSCULAR | Status: AC
  Filled 2024-01-29: qty 2

## 2024-01-29 MED ORDER — CHLORHEXIDINE GLUCONATE 0.12 % MT SOLN
15.0000 mL | Freq: Once | OROMUCOSAL | Status: AC
  Administered 2024-01-29: 15 mL via OROMUCOSAL

## 2024-01-29 MED ORDER — IRRISEPT - 450ML BOTTLE WITH 0.05% CHG IN STERILE WATER, USP 99.95% OPTIME
TOPICAL | Status: DC | PRN
  Administered 2024-01-29: 450 mL

## 2024-01-29 MED ORDER — EPHEDRINE SULFATE-NACL 50-0.9 MG/10ML-% IV SOSY
PREFILLED_SYRINGE | INTRAVENOUS | Status: DC | PRN
  Administered 2024-01-29: 10 mg via INTRAVENOUS

## 2024-01-29 MED ORDER — PHENYLEPHRINE 80 MCG/ML (10ML) SYRINGE FOR IV PUSH (FOR BLOOD PRESSURE SUPPORT)
PREFILLED_SYRINGE | INTRAVENOUS | Status: DC | PRN
  Administered 2024-01-29 (×2): 80 ug via INTRAVENOUS

## 2024-01-29 MED ORDER — ONDANSETRON HCL 4 MG/2ML IJ SOLN
INTRAMUSCULAR | Status: DC | PRN
  Administered 2024-01-29: 4 mg via INTRAVENOUS

## 2024-01-29 MED ORDER — LIDOCAINE HCL (CARDIAC) PF 100 MG/5ML IV SOSY
PREFILLED_SYRINGE | INTRAVENOUS | Status: DC | PRN
  Administered 2024-01-29: 100 mg via INTRAVENOUS

## 2024-01-29 MED ORDER — SCOPOLAMINE 1 MG/3DAYS TD PT72
1.0000 | MEDICATED_PATCH | TRANSDERMAL | Status: DC
  Administered 2024-01-29: 1.5 mg via TRANSDERMAL

## 2024-01-29 MED ORDER — KETOROLAC TROMETHAMINE 30 MG/ML IJ SOLN
INTRAMUSCULAR | Status: AC
  Filled 2024-01-29: qty 1

## 2024-01-29 MED ORDER — ONDANSETRON HCL 4 MG/2ML IJ SOLN: 4.0000 mg | Freq: Once | INTRAMUSCULAR | Status: DC | PRN

## 2024-01-29 MED ORDER — OXYCODONE HCL 5 MG/5ML PO SOLN: 5.0000 mg | Freq: Once | ORAL | Status: AC | PRN

## 2024-01-29 MED ORDER — PROPOFOL 10 MG/ML IV BOLUS
INTRAVENOUS | Status: DC | PRN
  Administered 2024-01-29: 150 ug/kg/min via INTRAVENOUS

## 2024-01-29 MED ORDER — PHENYLEPHRINE HCL-NACL 20-0.9 MG/250ML-% IV SOLN
INTRAVENOUS | Status: AC
  Filled 2024-01-29: qty 250

## 2024-01-29 MED ORDER — OXYCODONE HCL 5 MG PO TABS: 5.0000 mg | ORAL_TABLET | ORAL | 0 refills | Status: DC | PRN

## 2024-01-29 MED ORDER — LACTATED RINGERS IV SOLN: INTRAVENOUS | Status: DC | PRN

## 2024-01-29 MED ORDER — FENTANYL CITRATE (PF) 100 MCG/2ML IJ SOLN
25.0000 ug | INTRAMUSCULAR | Status: DC | PRN
  Administered 2024-01-29 (×2): 50 ug via INTRAVENOUS

## 2024-01-29 MED ORDER — OXYCODONE HCL 5 MG PO TABS
5.0000 mg | ORAL_TABLET | Freq: Once | ORAL | Status: AC | PRN
  Administered 2024-01-29: 5 mg via ORAL

## 2024-01-29 MED ORDER — ACETAMINOPHEN 10 MG/ML IV SOLN: 1000.0000 mg | Freq: Once | INTRAVENOUS | Status: DC | PRN

## 2024-01-29 MED ORDER — OXYCODONE HCL 5 MG PO TABS
ORAL_TABLET | ORAL | Status: AC
  Filled 2024-01-29: qty 1

## 2024-01-29 MED ORDER — ONDANSETRON 4 MG PO TBDP: 4.0000 mg | ORAL_TABLET | Freq: Three times a day (TID) | ORAL | 0 refills | Status: DC | PRN

## 2024-01-29 SURGICAL SUPPLY — 54 items
ADAPTER IRRIG TUBE 2 SPIKE SOL (ADAPTER) ×2 IMPLANT
ANCHOR 2.3 SP SGL 1.2 XBRAID (Anchor) IMPLANT
ANCHOR SWIVELOCK BIO 4.75X19.1 (Anchor) IMPLANT
BLADE FULL RADIUS 3.5 (BLADE) IMPLANT
BLADE SHAVER 4.5X7 STR FR (MISCELLANEOUS) IMPLANT
BNDG ADH 1X3 SHEER STRL LF (GAUZE/BANDAGES/DRESSINGS) ×6 IMPLANT
BUR BR 5.5 WIDE MOUTH (BURR) IMPLANT
CANNULA TWIST IN 8.25X7CM (CANNULA) ×1 IMPLANT
CHLORAPREP W/TINT 26 (MISCELLANEOUS) ×1 IMPLANT
COOLER POLAR GLACIER W/PUMP (MISCELLANEOUS) IMPLANT
DERMABOND ADVANCED .7 DNX12 (GAUZE/BANDAGES/DRESSINGS) IMPLANT
DRAPE C-ARM XRAY 36X54 (DRAPES) ×1 IMPLANT
DRAPE INCISE IOBAN 66X45 STRL (DRAPES) IMPLANT
DRAPE U-SHAPE 47X51 STRL (DRAPES) ×2 IMPLANT
DRSG OPSITE POSTOP 4X8 (GAUZE/BANDAGES/DRESSINGS) IMPLANT
GAUZE SPONGE 4X4 12PLY STRL (GAUZE/BANDAGES/DRESSINGS) ×1 IMPLANT
GAUZE XEROFORM 1X8 LF (GAUZE/BANDAGES/DRESSINGS) ×1 IMPLANT
GLOVE BIO SURGEON STRL SZ7.5 (GLOVE) ×2 IMPLANT
GLOVE BIO SURGEON STRL SZ8 (GLOVE) ×2 IMPLANT
GLOVE BIOGEL PI IND STRL 8 (GLOVE) ×1 IMPLANT
GLOVE INDICATOR 8.0 STRL GRN (GLOVE) ×1 IMPLANT
GLOVE SURG ORTHO 8.0 STRL STRW (GLOVE) ×1 IMPLANT
GLOVE SURG SYN 7.5 E (GLOVE) ×1 IMPLANT
GLOVE SURG SYN 7.5 PF PI (GLOVE) ×1 IMPLANT
GOWN STRL REUS W/ TWL LRG LVL3 (GOWN DISPOSABLE) ×1 IMPLANT
GOWN STRL REUS W/ TWL XL LVL3 (GOWN DISPOSABLE) ×1 IMPLANT
IV NS IRRIG 3000ML ARTHROMATIC (IV SOLUTION) ×4 IMPLANT
JET LAVAGE IRRISEPT WOUND (IRRIGATION / IRRIGATOR) ×1 IMPLANT
KIT TURNOVER KIT A (KITS) ×1 IMPLANT
LAVAGE JET IRRISEPT WOUND (IRRIGATION / IRRIGATOR) IMPLANT
MANIFOLD NEPTUNE II (INSTRUMENTS) ×1 IMPLANT
MAT ABSORB FLUID 56X50 GRAY (MISCELLANEOUS) ×1 IMPLANT
NDL HYPO 22X1.5 SAFETY MO (MISCELLANEOUS) ×1 IMPLANT
NEEDLE HYPO 22X1.5 SAFETY MO (MISCELLANEOUS) ×1 IMPLANT
PACK ARTHROSCOPY SHOULDER (MISCELLANEOUS) ×1 IMPLANT
PACK HIP PROSTHESIS (MISCELLANEOUS) IMPLANT
PAD ABD DERMACEA PRESS 5X9 (GAUZE/BANDAGES/DRESSINGS) ×1 IMPLANT
PAD WRAPON POLOR MULTI XL (MISCELLANEOUS) IMPLANT
PASSER SUT FIRSTPASS SELF (INSTRUMENTS) IMPLANT
SPONGE T-LAP 18X18 ~~LOC~~+RFID (SPONGE) ×1 IMPLANT
SUT ETHILON 3-0 FS-10 30 BLK (SUTURE) IMPLANT
SUT MNCRL AB 3-0 PS2 27 (SUTURE) IMPLANT
SUT MNCRL AB 4-0 PS2 18 (SUTURE) IMPLANT
SUT VIC AB 1 CT1 36 (SUTURE) IMPLANT
SUT VIC AB 2-0 CT2 27 (SUTURE) ×1 IMPLANT
SUTURE EHLN 3-0 FS-10 30 BLK (SUTURE) ×1 IMPLANT
SYR 10ML LL (SYRINGE) ×1 IMPLANT
SYR 20ML LL LF (SYRINGE) ×1 IMPLANT
TRAP FLUID SMOKE EVACUATOR (MISCELLANEOUS) ×1 IMPLANT
TUBE SET DOUBLEFLO INFLOW (TUBING) ×1 IMPLANT
TUBE SET DOUBLEFLO OUTFLOW (TUBING) ×1 IMPLANT
WAND 4.6 50D HIPVAC 50 IFS (SURGICAL WAND) ×1 IMPLANT
WATER STERILE IRR 500ML POUR (IV SOLUTION) ×1 IMPLANT
WRAP-ON POLOR PAD MULTI XL (MISCELLANEOUS) ×1 IMPLANT

## 2024-01-29 NOTE — Anesthesia Procedure Notes (Signed)
 Procedure Name: Intubation Date/Time: 01/29/2024 11:09 AM  Performed by: Lysbeth Penner, CRNAPre-anesthesia Checklist: Patient identified, Emergency Drugs available, Suction available and Patient being monitored Patient Re-evaluated:Patient Re-evaluated prior to induction Oxygen Delivery Method: Circle system utilized Preoxygenation: Pre-oxygenation with 100% oxygen Induction Type: IV induction Ventilation: Mask ventilation without difficulty Laryngoscope Size: McGrath and 3 Grade View: Grade I Tube type: Oral Tube size: 6.5 mm Number of attempts: 1 Airway Equipment and Method: Stylet and Oral airway Placement Confirmation: ETT inserted through vocal cords under direct vision, positive ETCO2 and breath sounds checked- equal and bilateral Secured at: 19 cm Tube secured with: Tape Dental Injury: Teeth and Oropharynx as per pre-operative assessment

## 2024-01-29 NOTE — Transfer of Care (Signed)
 Immediate Anesthesia Transfer of Care Note  Patient: Brittany Franklin  Procedure(s) Performed: Left hip open gluteus medius repair, trochanteric bursectomy (Left: Hip)  Patient Location: PACU  Anesthesia Type:General  Level of Consciousness: drowsy  Airway & Oxygen Therapy: Patient Spontanous Breathing  Post-op Assessment: Report given to RN and Post -op Vital signs reviewed and stable  Post vital signs: Reviewed and stable  Last Vitals:  Vitals Value Taken Time  BP 110/51 01/29/24 1239  Temp    Pulse 68 01/29/24 1242  Resp 19 01/29/24 1242  SpO2 95 % 01/29/24 1242  Vitals shown include unfiled device data.  Last Pain:  Vitals:   01/29/24 1004  TempSrc: Temporal  PainSc: 0-No pain         Complications: There were no known notable events for this encounter.

## 2024-01-29 NOTE — Discharge Instructions (Addendum)
 Gluteus Medius Repair Post-Operative Instructions   1. Plan for 1st Physical Therapy visit in ~3-4 days. FLAT FOOT WEIGHT BEARING (weight of leg only) for 6 weeks. 2. If oozing from surgery site occurs after 5-7 days postoperative, please contact Dr. Eliane Decree offices. 3. Icing is very important for the first 5-7 days postoperative, and ice is applied (ice packs or ice therapy) as often as possible or at least for 20-minute periods 3-4 times per day. Ice should not be applied directly on the skin. 4. You may remove the dressing at the time of PT. 5. Showering is allowed after dressing is removed if no drainage..  6.  Do not soak the hip in water in a bathtub or pool for ~4 weeks.  7. Driving is permitted after ~2 weeks if the narcotic pain medication is no longer being taken and you feel comfortable getting into and out of a car. Driving a manual car may take up to 4 weeks. 8. The anesthetic drugs used during your surgery may cause nausea for the first 24 hours. If nausea is encountered, drink only clear liquids (i.e. Sprite or 7-up). The only solids should be dry crackers or toast. If nausea and vomiting become severe or the patient shows sign of dehydration (lack of urination) please call the doctor or the surgicenter. 9. If you develop a fever (101.5), redness, or yellow/brown/green drainage from the surgical incision site, please call our office to arrange for an evaluation. 10. Enclosed are prescriptions for you to use post-operatively. 11. You will take aspirin (325 mg) daily for 4 weeks. This may lower the risk of a blood clot developing after surgery. Should severe calf pain occur or significant swelling of calf and ankle, please call the doctor. 12. Local anesthetics (i.e. Novocaine) are put into the incision after surgery. It is not uncommon for patients to encounter more pain on the first or second day after surgery. This is the time when swelling peaks. Taking pain medication before bedtime  will assist in sleeping. It is important not to drink or drive while taking narcotic medication.  You should resume your normal medications for other conditions the day after surgery. 13. Follow weight bearing instructions (Flat foot weight bearing) as advised at discharge. Crutches/walker may be necessary to assist walking.  14.  If unexpected problems occur and you need to speak to the doctor, call the office.  ACTIVITY RESTRICTIONS AND BRACING: -Brace should be worn at night while sleeping and locked in extension (0 degrees). Push orange tab down to lock in extension. If it will not push down, then the brace is probably not completely extended and may still be a bit bent.  -When up and ambulating, your brace can be unlocked from 0-90 degrees while following your touch-down weightbearing restrictions with crutches -Avoid sitting with your hips flexed 90 degrees for more than 30 minutes at a time -Avoid excessive external rotation or the "Figure-4" position with your leg crossed -We will updated your restrictions at your first postoperative visit   PAIN MEDICATION:  Tylenol 1000mg  3x/day until no pain 2.   Oxycodone 1 to 2 tablets by mouth every 4 hours as needed.   3.   Do NOT take anti-inflammatory medications as they can interfere with healing. These include ibuprofen (Advil, Motrin), naproxen (Aleve), meloxicam (Mobic), etodolac, diclofenac, etc.  BLOOD CLOT PREVENTION: Aspirin 325mg  by mouth daily for 4 weeks  ANTI-NAUSEA (if applicable): Zofran 4mg  tabs, 1 tablet by mouth every 6 hours as needed. *You  will be given a prescription, but it is optional to fill it.*  STOOL SOFTENER: Docusate 100mg  twice daily while taking pain medication   Important Contact Information [phone] (Office phone number)   Post-operative Brace: Apply and remove the brace you received as you were instructed to at the time of fitting and as described in detail as the brace's instructions for use  indicate.  Wear the brace for the period of time prescribed by your physician.  The brace can be cleaned with soap and water and allowed to air dry only.  Should the brace result in increased pain, decreased feeling (numbness/tingling), increased swelling or an overall worsening of your medical condition, please contact your doctor immediately.  If an emergency situation occurs as a result of wearing the brace after normal business hours, please dial 911 and seek immediate medical attention.  Let your doctor know if you have any further questions about the brace issued to you. Refer to the T-Scope Hip instructions for use if you have any questions regarding your hip brace.  [facility] Customer Care for Troubleshooting: [phone] Wrap brace around waist and attach with velcro.  Pull back support with thumb loop and adhere to waist belt.  3.   Clip on thigh strap and  tighten with strapping on front of thigh as needed.   POLAR CARE INFORMATION  MassAdvertisement.it  How to use [employer] Polar Care [facility] Therapy System?  YouTube   [email or web address].co.uk  OPERATING INSTRUCTIONS  Start the product With dry hands, connect the transformer to the electrical connection located on the top of the cooler. Next, plug the transformer into an appropriate electrical outlet. The unit will automatically start running at this point.  To stop the pump, disconnect electrical power.  Unplug to stop the product when not in use. Unplugging the Polar Care unit turns it off. Always unplug immediately after use. Never leave it plugged in while unattended. Remove pad.    FIRST ADD WATER TO FILL LINE, THEN ICE---Replace ice when existing ice is almost melted  1 Discuss Treatment with your Licensed Health Care Practitioner and Use Only as Prescribed 2 Apply Insulation Barrier & Cold Therapy Pad 3 Check for Moisture 4 Inspect Skin Regularly  Tips and Trouble Shooting Usage Tips 1. Use cubed or chunked  ice for optimal performance. 2. It is recommended to drain the Pad between uses. To drain the pad, hold the Pad upright with the hose pointed toward the ground. Depress the black plunger and allow water to drain out. 3. You may disconnect the Pad from the unit without removing the pad from the affected area by depressing the silver tabs on the hose coupling and gently pulling the hoses apart. The Pad and unit will seal itself and will not leak. Note: Some dripping during release is normal. 4. DO NOT RUN PUMP WITHOUT WATER! The pump in this unit is designed to run with water. Running the unit without water will cause permanent damage to the pump. 5. Unplug unit before removing lid.  TROUBLESHOOTING GUIDE Pump not running, Water not flowing to the pad, Pad is not getting cold 1. Make sure the transformer is plugged into the wall outlet. 2. Confirm that the ice and water are filled to the indicated levels. 3. Make sure there are no kinks in the pad. 4. Gently pull on the blue tube to make sure the tube/pad junction is straight. 5. Remove the pad from the treatment site and ll it while the pad is lying  at; then reapply. 6. Confirm that the pad couplings are securely attached to the unit. Listen for the double clicks (Figure 1) to confirm the pad couplings are securely attached.  Leaks    Note: Some condensation on the lines, controller, and pads is unavoidable, especially in warmer climates. 1. If using a B[employer] Polar Care Cold Therapy unit with a detachable Cold Therapy Pad, and a leak exists (other than condensation on the lines) disconnect the pad couplings. Make sure the silver tabs on the couplings are depressed before reconnecting the pad to the pump hose; then confirm both sides of the coupling are properly clicked in. 2. If the coupling continues to leak or a leak is detected in the pad itself, stop using it and call [employer] Customer Care at [phone].  Cleaning After use, empty and dry the  unit with a soft cloth. Warm water and mild detergent may be used occasionally to clean the pump and tubes.  WARNING: The Polar Care Cube can be cold enough to cause serious injury, including full skin necrosis. Follow these Operating Instructions, and carefully read the Product Insert (see pouch on side of unit) and the Cold Therapy Pad Fitting Instructions (provided with each Cold Therapy Pad) prior to use.

## 2024-01-29 NOTE — H&P (Signed)
 Paper H&P to be scanned into permanent record. H&P reviewed. No significant changes noted.

## 2024-01-29 NOTE — Anesthesia Postprocedure Evaluation (Signed)
 Anesthesia Post Note  Patient: Brittany Franklin  Procedure(s) Performed: Left hip open gluteus medius repair, trochanteric bursectomy (Left: Hip)  Patient location during evaluation: PACU Anesthesia Type: General Level of consciousness: awake and alert, oriented and patient cooperative Pain management: pain level controlled Vital Signs Assessment: post-procedure vital signs reviewed and stable Respiratory status: spontaneous breathing, nonlabored ventilation and respiratory function stable Cardiovascular status: blood pressure returned to baseline and stable Postop Assessment: adequate PO intake Anesthetic complications: no   There were no known notable events for this encounter.   Last Vitals:  Vitals:   01/29/24 1300 01/29/24 1310  BP: (!) 153/71   Pulse: 77 66  Resp: 14 13  Temp:    SpO2: 94% 99%    Last Pain:  Vitals:   01/29/24 1310  TempSrc:   PainSc: 5                  Reed Breech

## 2024-01-29 NOTE — Anesthesia Preprocedure Evaluation (Addendum)
 Anesthesia Evaluation  Patient identified by MRN, date of birth, ID band Patient awake    Reviewed: Allergy & Precautions, NPO status , Patient's Chart, lab work & pertinent test results  History of Anesthesia Complications (+) PONV and history of anesthetic complications  Airway Mallampati: I   Neck ROM: Full    Dental  (+) Missing   Pulmonary neg pulmonary ROS   Pulmonary exam normal breath sounds clear to auscultation       Cardiovascular hypertension, Normal cardiovascular exam Rhythm:Regular Rate:Normal  ECG 01/24/24: normal   Neuro/Psych  Neuromuscular disease (peripheral neuropathy)    GI/Hepatic hiatal hernia,GERD  ,,Colon CA   Endo/Other  negative endocrine ROS    Renal/GU negative Renal ROS     Musculoskeletal  (+) Arthritis ,    Abdominal   Peds  Hematology negative hematology ROS (+)   Anesthesia Other Findings   Reproductive/Obstetrics                             Anesthesia Physical Anesthesia Plan  ASA: 2  Anesthesia Plan: General   Post-op Pain Management:    Induction: Intravenous  PONV Risk Score and Plan: 4 or greater and Ondansetron, Dexamethasone, Treatment may vary due to age or medical condition and Scopolamine patch - Pre-op  Airway Management Planned: Oral ETT  Additional Equipment:   Intra-op Plan:   Post-operative Plan: Extubation in OR  Informed Consent: I have reviewed the patients History and Physical, chart, labs and discussed the procedure including the risks, benefits and alternatives for the proposed anesthesia with the patient or authorized representative who has indicated his/her understanding and acceptance.     Dental advisory given  Plan Discussed with: CRNA  Anesthesia Plan Comments: (Patient consented for risks of anesthesia including but not limited to:  - adverse reactions to medications - damage to eyes, teeth, lips or other  oral mucosa - nerve damage due to positioning  - sore throat or hoarseness - damage to heart, brain, nerves, lungs, other parts of body or loss of life  Informed patient about role of CRNA in peri- and intra-operative care.  Patient voiced understanding.)        Anesthesia Quick Evaluation

## 2024-01-29 NOTE — Op Note (Signed)
 DATE OF SURGERY:  01/29/2024    PREOPERATIVE DIAGNOSIS:  1. Left hip trochanteric bursitis 2. Left hip gluteus medius and minimus tear   POSTOPERATIVE DIAGNOSIS:  1. Left hip trochanteric bursitis 2. Left hip gluteus medius and minimus tear   PROCEDURE:  1. Left hip open trochanteric bursectomy 2. Left hip open gluteus medius repair   SURGEON: Rosealee Albee, MD   ASSISTANTS: Sonny Dandy, PA  EBL: 25cc   INDICATIONS: Brittany Franklin is a 73 y.o. female who has had chronic L lateral hip pain. Patient has failed extensive nonoperative measures including medical management, activity modifications, corticosteroid injections, and therapeutic exercises.  Pain is significantly affecting quality of life. Prior MRI showed gluteus medius and minimus tears with muscle atrophy. After discussion of risks, benefits, and alternatives to surgery, the patient elected to proceed with above procedure.   PROCEDURE:  The patient was identified in the preoperative holding area and the operative extremity was marked.  The patient was then transferred to the operating room suite and mobilized from the hospital gurney to the operating room table. Anesthesia was administered without complication. The patient was then transitioned to a lateral position.  All bony prominences were padded per protocol. An axillary roll was placed.  Careful attention was paid to the contralateral side peroneal nerve, which was free from pressure with use of appropriate padding and blankets. A time-out was performed to confirm the patient's identity and the correct laterality of surgery. The patient was then prepped and draped in the usual sterile fashion. Appropriate pre-operative antibiotics were administered.   An incision that centered on the posterior tip of the greater trochanter was made. Dissection was carried down through the subcutaneous tissue.  Careful attention was made to maintain hemostasis using electrocautery.   Dissection brought [place] to the level of IT band. The IT band was incised in curvilinear fashion over the most prominent aspect of the posterior greater trochanter. The trochanteric bursa was then visualized and was completely excised until short external rotators were completely visualized. Excision was performed with a combination of scissors and electrocautery.  Appropriate hemostasis was achieved.    The proximal aspect of the greater trochanter and entirety of the hip abductor footprint was completely exposed with only a few posterior gluteus medius fibers inserting onto the posterolateral tip of the greater trochanter. The gluteus medius and minimus tendons were identified. This was a complete U-shaped tear. The hip abductor tendons were mobilized by releasing soft tissue superficially and deep to the tendons.  Next, the footprint was prepared by eliminating any soft tissue and creating a bleeding bed of bone with gentle decortication utilizing a rongeur.  Two Iconix SPEED anchors were placed in the proximal aspect of the greater trochanter.  All 8 strands of suture from these anchors were passed through the hip abductor tendons. The anterior sets of sutures from the medial row SPEED anchors were then passed through a lateral row Arthrex 4.75 mm SwiveLock anchor.  This was then impacted into the anterior aspect of the greater trochanter just proximal to the vastus ridge while holding appropriate tension.  Similarly, this was performed using another SwiveLock anchor that was placed more posteriorly with the posterior SPEED sutures.  This allowed for excellent fixation of the gluteus medius and minimus tendons down to their appropriate footprint on the greater trochanter.  The repair suture from each SwiveLock anchor was shuttled through the anterior and posterior extents of the tears to further reduce the gluteus minimus and  medius.  This construct nicely reapproximated the hip abductor musculature with  essentially anatomic coverage of the entire footprint.  The repair was stable to gentle internal and external rotation as well as gentle abduction and adduction.  Care was taken to ensure appropriate hemostasis.    The wound was then thoroughly irrigated with normal saline and Irrisept solution, given prior history of contralateral infection.  Local anesthetic was injected.  The IT band was closed with #1 Vicryl suture. The subdermal layer was closed with 2-0 Vicryl in a buried interrupted fashion. Skin was closed with 4-0 Monocryl and Dermabond.  The wound was then dressed with a honeycomb dressing.  Polar Care and hip abduction brace were applied.  The patient was mobilized from the lateral position back to supine on the operating room table and then awakened from general anesthesia without complication.   Of note, assistance from a PA was essential to performing the surgery.  PA was present for the entire surgery.  PA assisted with patient positioning, retraction, instrumentation, and wound closure. The surgery would have been more difficult and had longer operative time without PA assistance.    POSTOPERATIVE PLAN: The patient will be FFWB on operative extremity x 6 weeks. Hip abduction brace to limit active abduction x 6 weeks. ASA 325mg /day x 4 weeks for DVT ppx.

## 2024-07-30 ENCOUNTER — Other Ambulatory Visit (HOSPITAL_COMMUNITY): Payer: Self-pay | Admitting: Student

## 2024-07-30 DIAGNOSIS — Z1231 Encounter for screening mammogram for malignant neoplasm of breast: Secondary | ICD-10-CM

## 2024-08-10 NOTE — Therapy (Unsigned)
 OUTPATIENT OCCUPATIONAL THERAPY ORTHO EVALUATION  Patient Name: CALYPSO HAGARTY MRN: [id number] DOB:18-Oct-1951, 73 y.o., female Today's Date: 08/11/2024  PCP: Franchot PA REFERRING PROVIDER: Dr Sharrie  END OF SESSION:  OT End of Session - 08/11/24 1827     Visit Number 1    Number of Visits 2    Date for OT Re-Evaluation 09/08/24    OT Start Time 1649    OT Stop Time 1806    OT Time Calculation (min) 77 min    Activity Tolerance Patient tolerated treatment well    Behavior During Therapy WFL for tasks assessed/performed          Past Medical History:  Diagnosis Date   Arthritis    Cancer (HCC)    COLON CANCER   Colitis    Colon cancer (HCC)    GERD (gastroesophageal reflux disease)    GERD without esophagitis    History of colon polyps    History of hiatal hernia    small   Hyperlipidemia    Hypertension    Numbness and tingling in both hands    Osteopenia    Osteopenia of multiple sites    PONV (postoperative nausea and vomiting)    very bad nausea after colon surgery   Rosacea    Past Surgical History:  Procedure Laterality Date   ABDOMINAL HYSTERECTOMY     BREAST BIOPSY Right 07/19/2022   Stereo right bx, Ribbon Clip - CYSTIC PAPILLARY APOCRINE METAPLASIA.   CHOLECYSTECTOMY     COLON SURGERY  2012   COLONOSCOPY WITH PROPOFOL  N/A 10/30/2017   Procedure: COLONOSCOPY WITH PROPOFOL ;  Surgeon: Gaylyn Gladis PENNER, MD;  Location: [facility] ENDOSCOPY;  Service: Endoscopy;  Laterality: N/A;   COLONOSCOPY WITH PROPOFOL  N/A 10/15/2020   Procedure: COLONOSCOPY WITH PROPOFOL ;  Surgeon: Maryruth Ole DASEN, MD;  Location: [facility] ENDOSCOPY;  Service: Endoscopy;  Laterality: N/A;   ESOPHAGOGASTRODUODENOSCOPY (EGD) WITH PROPOFOL  N/A 10/30/2017   Procedure: ESOPHAGOGASTRODUODENOSCOPY (EGD) WITH PROPOFOL ;  Surgeon: Gaylyn Gladis PENNER, MD;  Location[facility] ENDOSCOPY;  Service: Endoscopy;  Laterality: N/A;   ESOPHAGOGASTRODUODENOSCOPY (EGD) WITH PROPOFOL  N/A 10/19/2021    Procedure: ESOPHAGOGASTRODUODENOSCOPY (EGD) WITH PROPOFOL ;  Surgeon: Toledo, Ladell POUR, MD;  Location: [facility] ENDOSCOPY;  Service: Gastroenterology;  Laterality: N/A;   EYE SURGERY Bilateral    January 17and January 04, 2024   TONSILLECTOMY     as achild   There are no active problems to display for this patient.   ONSET DATE: 3-4 months ago  REFERRING DIAG: Nontraumatic sagittal band rupture right and left hand  THERAPY DIAG:  Pain in both hands  Nontraumatic rupture of sagittal band of extensor tendon of right upper extremity  Nontraumatic rupture of sagittal band of extensor tendon of left upper extremity  Rationale for Evaluation and Treatment: Rehabilitation  SUBJECTIVE:   SUBJECTIVE STATEMENT: Started about 3 to 4 months ago.  When I make a fist it clicked a little bit but then it got worse and worse.  It hurts when I try and chop food or cut food time and shoes. Pt accompanied by: self  PERTINENT HISTORY: 07/17/24 Dr Sharrie note : FINDINGS:  Normal finger alignment. No scapholunate dissociation. No fractures or dislocations. No soft tissue swelling or joint effusion. Mild IP joint arthritis diffusely with osteophyte formation. Mild-moderate thumb CMC joint arthritis change with osteophyte formation, subluxed position. No loose bodies. No abnormal bone lesions.  I personally reviewed and visualized the imaging studies if available. I additionally personally interpreted any radiographs taken during  today's visit.  Assessment:   ICD-10-CM  1. Pain in joints of right hand M25.541  2. Nontraumatic sagittal band rupture of extensor tendon, right M66.241  3. Nontraumatic sagittal band rupture of extensor tendon, left M66.242  4. 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 The plan is to do the following;  - The patient has right  greater than left hand issues related to snapping tendons. I explained that she could have a trigger finger versus sagittal band rupture which was atraumatic. - She has obvious subluxation of her extensor tendons on the right and left hand. She feels more functional impairment on the right. No weakness or deviation of the digit when this subluxes. X-ray of the hand shows some arthritis changes with mild. I discussed the possible causes of her symptoms with suspicion for sagittal band rupture causing tendon instability. I discussed her case with one of my surgical partners who recommended she could consider orthotics and Occupational Therapy versus surgery. She did not wish to consider surgery so I referred occupational therapy. - Activity as tolerated. Modify activity as needed according to symptoms. No limitations to weight bearing.  - Refer to OT for orthotics. Continue home exercise program to maintain strength, flexibility, and endurance. - Use Tylenol , anti-inflammatories, topical diclofenac/pain cream, relative rest, compression, massage, and ice/heat as needed for pain.  - Follow up as needed.   PRECAUTIONS: None     WEIGHT BEARING RESTRICTIONS: No  PAIN:  Are you having pain?  5/10 pain with a tight grip like chopping food but pain just without activity does not linger  FALLS: Has patient fallen in last 6 months? No  LIVING ENVIRONMENT: Lives with: spouse    PLOF: Patient is a retired Tourist information centre manager.  She is very active working out-doing spin class, total body workout, working out 2 times a week with a trainer  PATIENT GOALS: If this any splinting or therapy that can help for this  NEXT MD VISIT: Contact MD 4 weeks for possible consultation with a hand surgeon  OBJECTIVE:  Note: Objective measures were completed at Evaluation unless otherwise noted.  HAND DOMINANCE: Right  ADLs: Patient has pain with chopping or cutting food or tying shoes or sometimes brushing  teeth; and carrying groceries  FUNCTIONAL OUTCOME MEASURES: Next session   UPPER EXTREMITY ROM:     Wrist and elbow active range of motion within normal limits as well as strength 5 out of 5   Active ROM Right eval Left eval  Thumb MCP (0-60)    Thumb IP (0-80)    Thumb Radial abd/add (0-55)     Thumb Palmar abd/add (0-45)     Thumb Opposition to Small Finger     Index MCP (0-90)     Index PIP (0-100)     Index DIP (0-70)      Long MCP (0-90)      Long PIP (0-100)      Long DIP (0-70)      Ring MCP (0-90)      Ring PIP (0-100)      Ring DIP (0-70)      Little MCP (0-90)      Little PIP (0-100)      Little DIP (0-70)      Patient has normal fisting.  Does notice patient grip very tight knuckles turning white. And with  any composite fist even a light fist patient has tendon subluxation at right 3rd and 4th and left 2nd and 3rd Opposition within normal limits but also subluxation at right 3rd and 4th and left 2nd and 3rd    HAND FUNCTION: Grip strength: Right: 65 lbs; Left: 55 lbs, Lateral pinch: Right: 14 lbs, Left: 14 lbs, and 3 point pinch: Right: 12 lbs, Left: 13 lbs  COORDINATION: Within normal limits  SENSATION: Denies any sensory issues  EDEMA: None reported or noticed  COGNITION: Overall cognitive status: Within functional limits for tasks assessed     TREATMENT DATE: 08/11/24                                                                                                                            Attempted several hand-based custom splints for sagittal band ruptures.  And preventing subluxation of extensor tendons with composite fist. Patient continues to sublux with smaller and finger based splinting. Also because patient has 2 fingers adjacent to each other With attempts of a hand-based larger splint-it will immobilize patient more making her hand harder to functionally use. Report not probably will be compliant.  Did extended review with patient  about joint protection and modifications as well as building up her grips and handles to avoid tight over gripping of objects As well as using larger joints like palms and forearms. Avoid static tight grips or lateral pinches Handout was provided and reviewed with patient.     PATIENT EDUCATION: Education details: findings of eval and HEP /modifications and joint protection Person educated: Patient Education method: Explanation, Demonstration, Tactile cues, Verbal cues, and Handouts Education comprehension: verbalized understanding, returned demonstration, verbal cues required, and needs further education     GOALS: Goals reviewed with patient? Yes     LONG TERM GOALS: Target date: 4 wks  Patient verbalize and demonstrate 3 joint protection and modifications to implement with ADLs and IADLs to decrease subluxation of extensor tendons right and left hand. Baseline: Patient has no knowledge about causes or activities and modifications that can be done to decrease amount and activities with subluxating of extensor tendons Goal status: INITIAL  2.  Splints to decrease subluxation of extensor tendons on the right and left hand Baseline: Attempted several's.  Once that is decreasing subluxation is a larger hand-based splint and will limit patient's functional use and verbalized probably would not use it. Goal status: Met ASSESSMENT:  CLINICAL IMPRESSION: Patient seen today for occupational therapy evaluation for bilateral nontraumatic sagittal band rupture on the right and left hand.  With subluxation of the extensor tendons on the right 3rd and 4th and left 2nd and 3rd digits.  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  Patient report pain only with chopping or cutting food, carrying groceries and brushing her teeth.  5/10 with activity but does not linger.  Smaller hand base splints attempted but not preventing subluxation.  Larger  hand-based  splint will immobilize patient more and limit her functional use of right and left hand.  Patient verbalized would probably be not using the compliant with wearing it.  Did review with patient modifications and joint protection for avoiding tight and prolonged grips and lateral pinches.  Patient to enlarge grips or use larger joints like palms of forearm.  Information provided and reviewed with patient.  Patient can follow-up with me if needed in 2 to 3 weeks.  But would recommend otherwise for patient to follow-up with a hand surgeon for possible surgery.  PERFORMANCE DEFICITS: in functional skills including ADLs, IADLs, ROM, strength, pain, flexibility, decreased knowledge of use of DME, and UE functional use,   and psychosocial skills including environmental adaptation and routines and behaviors.   IMPAIRMENTS: are limiting patient from ADLs, IADLs, rest and sleep, play, leisure, and social participation.   COMORBIDITIES: has no other co-morbidities that affects occupational performance. Patient will benefit from skilled OT to address above impairments and improve overall function.  MODIFICATION OR ASSISTANCE TO COMPLETE EVALUATION: No modification of tasks or assist necessary to complete an evaluation.  OT OCCUPATIONAL PROFILE AND HISTORY: Problem focused assessment: Including review of records relating to presenting problem.  CLINICAL DECISION MAKING: LOW - limited treatment options, no task modification necessary  REHAB POTENTIAL: Good for goals  EVALUATION COMPLEXITY: Low      PLAN:  OT FREQUENCY: 2 visits  OT DURATION: 4 weeks  PLANNED INTERVENTIONS: 97168 OT Re-evaluation, 97535 self care/ADL training, 02889 therapeutic exercise, 97530 therapeutic activity, 97112 neuromuscular re-education, 97140 manual therapy, 97018 paraffin, 02960 fluidotherapy, 97034 contrast bath, 97760 Orthotic Initial, [id number] Orthotic/Prosthetic subsequent, passive range of motion, patient/family  education, and DME and/or AE instructions    CONSULTED AND AGREED WITH PLAN OF CARE: Patient     Ancel Peters, OTR/L,CLT 08/11/2024, 6:30 PM

## 2024-08-11 ENCOUNTER — Ambulatory Visit: Attending: Sports Medicine | Admitting: Occupational Therapy

## 2024-08-11 ENCOUNTER — Encounter: Payer: Self-pay | Admitting: Occupational Therapy

## 2024-08-11 DIAGNOSIS — M79642 Pain in left hand: Secondary | ICD-10-CM | POA: Diagnosis present

## 2024-08-11 DIAGNOSIS — M66241 Spontaneous rupture of extensor tendons, right hand: Secondary | ICD-10-CM | POA: Diagnosis present

## 2024-08-11 DIAGNOSIS — M79641 Pain in right hand: Secondary | ICD-10-CM | POA: Insufficient documentation

## 2024-08-11 DIAGNOSIS — M66242 Spontaneous rupture of extensor tendons, left hand: Secondary | ICD-10-CM | POA: Insufficient documentation

## 2024-09-08 ENCOUNTER — Other Ambulatory Visit: Payer: Self-pay

## 2024-09-11 ENCOUNTER — Encounter: Admitting: Occupational Therapy

## 2024-09-15 ENCOUNTER — Ambulatory Visit (HOSPITAL_COMMUNITY)

## 2024-09-18 ENCOUNTER — Ambulatory Visit
Admission: RE | Admit: 2024-09-18 | Discharge: 2024-09-18 | Disposition: A | Discharge status: Home / Self Care | Source: Ambulatory Visit | Attending: Student | Admitting: Student

## 2024-09-18 DIAGNOSIS — Z1231 Encounter for screening mammogram for malignant neoplasm of breast: Secondary | ICD-10-CM | POA: Insufficient documentation

## 2024-09-18 NOTE — Anesthesia Preprocedure Evaluation (Addendum)
 Anesthesia Evaluation  Patient identified by MRN, date of birth, ID band Patient awake    Reviewed: Allergy & Precautions, H&P , NPO status , Patient's Chart, lab work & pertinent test results  History of Anesthesia Complications (+) PONV and history of anesthetic complications  Airway Mallampati: II  TM Distance: >3 FB Neck ROM: Full    Dental no notable dental hx.    Pulmonary neg pulmonary ROS   Pulmonary exam normal breath sounds clear to auscultation       Cardiovascular hypertension, Normal cardiovascular exam Rhythm:Regular Rate:Normal     Neuro/Psych  Neuromuscular disease negative neurological ROS  negative psych ROS   GI/Hepatic negative GI ROS, Neg liver ROS, hiatal hernia,GERD  ,,  Endo/Other  negative endocrine ROS    Renal/GU negative Renal ROS  negative genitourinary   Musculoskeletal negative musculoskeletal ROS (+) Arthritis ,    Abdominal   Peds negative pediatric ROS (+)  Hematology negative hematology ROS (+)   Anesthesia Other Findings Medical History  GERD (gastroesophageal reflux disease)-mild Rosacea Hyperlipidemia  Hypertension Osteopenia  Cancer (HCC) Colon cancer (HCC)  History of colon polyps GERD without esophagitis  Osteopenia of multiple sites History of hiatal hernia  PONV (postoperative nausea and vomiting) had scopolamine  patch in Feb 2025 and was really happy that she did not have any n/v Colitis  Numbness and tingling in both hands Arthritis  Neuromuscular disorder (HCC    Reproductive/Obstetrics negative OB ROS                              Anesthesia Physical Anesthesia Plan  ASA: 3  Anesthesia Plan: General   Post-op Pain Management:    Induction: Intravenous, Cricoid pressure planned and Rapid sequence  PONV Risk Score and Plan:   Airway Management Planned: Oral ETT  Additional Equipment:   Intra-op Plan:    Post-operative Plan: Extubation in OR  Informed Consent: I have reviewed the patients History and Physical, chart, labs and discussed the procedure including the risks, benefits and alternatives for the proposed anesthesia with the patient or authorized representative who has indicated his/her understanding and acceptance.     Dental Advisory Given  Plan Discussed with: Anesthesiologist, CRNA and Surgeon  Anesthesia Plan Comments: (Patient consented for risks of anesthesia including but not limited to:  - adverse reactions to medications - damage to eyes, teeth, lips or other oral mucosa - nerve damage due to positioning  - sore throat or hoarseness - Damage to heart, brain, nerves, lungs, other parts of body or loss of life  Patient voiced understanding and assent.)         Anesthesia Quick Evaluation

## 2024-09-22 NOTE — H&P (View-Only) (Signed)
 ORTHOPAEDIC SURGERY- CLINIC NOTE  Chief Complaint: Bilateral hand sagittal band ruptures  History of Present Illness:  Brittany Franklin is a 73 y.o. female who presents today for the above.  I last saw her about a month ago and at that time she was complaining of primarily symptomatic right middle finger extensor tendon snapping causing pain and dysfunction.  She is also complaining of left middle finger symptoms of a similar nature but these were less severe and not bothersome.  This has been going on since February and she trialed nonoperative therapy with splints but this has failed to improve her symptoms.  Today, she also complains of the right ring finger snapping.  And would like to get this addressed in the same setting.  Prior medical records were reviewed.    PMHx, PSurgHx, Fam Hx, Soc Hx, Meds, Allergies: Past Medical History:  Diagnosis Date  . Collagenous colitis   . GERD (gastroesophageal reflux disease)   . History of cataract   . History of colon cancer   . History of rosacea   . Hyperlipidemia   . Hypertension   . Lumbar degenerative disc disease   . OAB (overactive bladder)   . Osteopenia   . PONV (postoperative nausea and vomiting)     Past Surgical History:  Procedure Laterality Date  . HYSTERECTOMY  2002   TAH BSO  . COLONOSCOPY  10/30/2017   DIFFUSE MICROSCOPIC COLITIS/PHx Cp/Repeat 15yrs/MUS  . EGD  10/30/2017   GASTRIC POLYP STOMACH BODY ACUTE INFLAMMATION/No Repeat/MUS  . COLONOSCOPY  10/15/2020   Diverticulosis/Collagenous Colitis/PHx CC/Repeat 52yrs/CTL  . EGD  10/19/2021   Normal EGD biopsy/Gastritis/No repeat/TKT  . Rt hip endoscopic loose bodyr emoval, open gluteus medius tear, open trochanteric bursitis Right 08/29/2022   Dr. Tobie  . Right hip incision and drianage of deep abscess, right hip excisional debridement of muscle and bone Right 09/25/2022   Dr. Tobie  . EXTRACTION CATARACT EXTRACAPSULAR W/INSERTION INTRAOCULAR PROSTHESIS  Right 12/21/2023   Procedure: Right- LenSx - EXTRACTION CATARACT EXTRACAPSULAR WITH PHACO WITH INSERTION INTRAOCULAR PROSTHESIS;  Surgeon: Thurman Therisa Cohn, MD;  Location: [facility];  Service: Ophthalmology;  Laterality: Right;  . EXTRACTION CATARACT EXTRACAPSULAR W/INSERTION INTRAOCULAR PROSTHESIS Left 01/04/2024   Procedure: Left - LenSx - EXTRACTION CATARACT EXTRACAPSULAR WITH PHACO WITH INSERTION INTRAOCULAR PROSTHESIS;  Surgeon: Thurman Therisa Cohn, MD;  Location: [facility];  Service: Ophthalmology;  Laterality: Left;  . Hip open gluteus medius repair Left 01/29/2024   Dr. Tobie  . Lt hip open trochanteric bursectomy, open gluteus medius repair Left 01/29/2024   Dr. Tobie  . APPENDECTOMY    . CHOLECYSTECTOMY    . Colon cancer surgery    . COLONOSCOPY  05/04/2014 ?   PH Polyps  . TONSILLECTOMY      Family History  Problem Relation Age of Onset  . High blood pressure (Hypertension) Mother   . Liver cancer Mother   . Heart disease Father   . High blood pressure (Hypertension) Father   . Hyperlipidemia (Elevated cholesterol) Father   . Kidney cancer Sister   . Stroke Sister   . Diabetes type II Sister   . High blood pressure (Hypertension) Sister   . Diabetes type II Sister   . High blood pressure (Hypertension) Sister   . Heart disease Sister   . Hyperlipidemia (Elevated cholesterol) Sister   . High blood pressure (Hypertension) Sister   . Coronary Artery Disease (Blocked arteries around heart) Sister   . Obesity Daughter   .  Diabetes Daughter        prediabetic  . High blood pressure (Hypertension) Daughter   . Obesity Son   . Lung disease Son   . Anxiety Son   . High blood pressure (Hypertension) Son     Social History   Socioeconomic History  . Marital status: Married  Tobacco Use  . Smoking status: Never    Passive exposure: Never  . Smokeless tobacco: Never  Vaping Use  . Vaping status: Never Used  Substance and Sexual Activity  . Alcohol use: Yes     Comment: 1-2x per week  . Drug use: No  . Sexual activity: Yes    Birth control/protection: Surgical    Comment: hysterectomy  Social History Narrative   Marital Status- Married   Lives with husband   Employment- Retired   Exercise hx- Goes to gym 5x/weekly   Religious Affiliation- Catholic    Social Drivers of Health   Financial Resource Strain: Low Risk  (09/22/2024)   Overall Financial Resource Strain (CARDIA)   . Difficulty of Paying Living Expenses: Not hard at all  Food Insecurity: No Food Insecurity (09/22/2024)   Hunger Vital Sign   . Worried About Programme researcher, broadcasting/film/video in the Last Year: Never true   . Ran Out of Food in the Last Year: Never true  Transportation Needs: No Transportation Needs (09/22/2024)   PRAPARE - Transportation   . Lack of Transportation (Medical): No   . Lack of Transportation (Non-Medical): No  Housing Stability: Low Risk  (09/22/2024)   Housing Stability Vital Sign   . Unable to Pay for Housing in the Last Year: No   . Number of Times Moved in the Last Year: 0   . Homeless in the Last Year: No     Current Outpatient Medications  Medication Sig Dispense Refill  . alendronate (FOSAMAX) 70 MG tablet TAKE ONE TABLET BY MOUTH EVERY WEEK *TAKE ON AN EMPTY STOMACH WITH A FULL GLASS OF WATER , AVOID MINERAL OR WELL WATER . DO NOT EAT OR TAKE OTHER MEDICATIONS FOR AT LEAST 30 MINUTES AFTER DOSE. SIT OR STAND FOR AT LEAST 30 MINUTES AFTER DOSE* 12 tablet 1  . budesonide (ENTOCORT EC) 3 mg EC capsule TAKE 3 CAPSULES BY MOUTH IN THE MORNING 270 capsule 0  . calcium carbonate (TUMS E-X) 300 mg (750 mg) chewable tablet Take 300 mg of elemental by mouth every 2 (two) hours as needed for Heartburn    . calcium citrate-vitamin D3 (CITRACAL+D) 315 mg-5 mcg (200 unit) tablet Take 2 tablets by mouth once daily Takes 650 ng x 2 times    . cetirizine (ZYRTEC) 10 mg capsule Take 10 mg by mouth once daily as needed for Allergies    . chlorhexidine  (PERIDEX ) 0.12 %  solution Swish and spit    . ezetimibe (ZETIA) 10 mg tablet TAKE ONE TABLET BY MOUTH ONE TIME DAILY 30 tablet 2  . Herbal Supplement Osteo BiFlex- 2 tablets by mouth once daily    . Lactobacillus acidophilus (PROBIOTIC ORAL) Take 1 capsule by mouth once daily [employer] Capsul    . losartan-hydroCHLOROthiazide (HYZAAR) 50-12.5 mg tablet TAKE ONE TABLET BY MOUTH ONE TIME DAILY 90 tablet 1  . nitrofurantoin, macrocrystal-monohydrate, (MACROBID) 100 MG capsule     . omeprazole (PRILOSEC) 20 MG DR capsule TAKE ONE CAPSULE BY MOUTH ONE TIME DAILY 30 capsule 11  . solifenacin (VESICARE) 5 MG tablet Take 1 tablet (5 mg total) by mouth once daily 90 tablet 3  No current facility-administered medications for this visit.    No Known Allergies  Review of Systems: A 10+ ROS was performed, reviewed, and the pertinent orthopaedic findings are documented in the HPI.  I have reviewed and agree with the ROS captured by the CMA.    Physical Exam: BP 132/86   Ht 170.2 cm (5' 7)   Wt 68 kg (150 lb)   BMI 23.49 kg/m  General/Constitutional: No apparent distress: well-nourished and well developed. Eyes: Pupils equal, round with synchronous movement. Lymphatic: No palpable adenopathy. Respiratory: Patient has good chest rise and fall with inspiration and expiration.  All lung fields are clear to auscultation bilaterally.  There is no Rales, rhonchi or wheezes appreciated. Cardiovascular: Upon auscultation there is a regular rate and rhythm without any murmurs, rubs, gallops or heaves appreciated. Integumentary: No impressive skin lesions present, except as noted in detailed exam. Neuro/Psych: Normal mood and affect, oriented to person, place and time. Musculoskeletal: see exam below  Right Upper Extremity:  The patient has ulnar subluxation of the extensor tendon over the middle finger MCP joint with flexion.  The patient is able to actively overcome this with extension but it causes a painful  snap.  The patient also has ulnar subluxation of the extensor tendon over the ring finger MCP joint with flexion, although, this typically requires more of a flexion force.  She is also able to actively overcome this with extension causing reduction of the tendon over the center of the MCP joint.  Sensation is intact to light touch throughout.  Digits are warm and well-perfused.  Left upper extremity: Ulnar subluxation of the extensor tendon over the middle finger MCP joint with flexion.  There is active reduction with digit extension.  Sensation is intact to light touch throughout.  Digits are warm and well-perfused.  Imaging and Results: NA  Assessment & Plan: Armonii Sieh is a 73 y.o. female patient with right hand middle finger and ring finger sagittal band ruptures - We will plan for a right middle finger and ring finger sagittal band reconstruction.  The risk, benefits, alternatives to surgery were discussed with the patient including but not limited to infection, repair failure, injury to nearby structures, stiffness, pain - Discussed the expected postoperative course occluding approximately 3 to 4 weeks of immobilization followed by hand therapy  Also with a left middle finger sagittal band rupture - This is minimally symptomatic for the patient and she would like to continue to manage with benign neglect at this time  Will follow-up postoperatively  Jackquline CANDIE Barrack, MD [facility] Orthopaedics and [facility] Medicine [address] [place], [place] [address] Phone: [phone]  This note was generated in part with voice recognition software; please excuse any typographical errors that were not detected and corrected.

## 2024-09-22 NOTE — Progress Notes (Signed)
 ORTHOPAEDIC SURGERY- CLINIC NOTE  Chief Complaint: Bilateral hand sagittal band ruptures  History of Present Illness:  Brittany Franklin is a 73 y.o. female who presents today for the above.  I last saw her about a month ago and at that time she was complaining of primarily symptomatic right middle finger extensor tendon snapping causing pain and dysfunction.  She is also complaining of left middle finger symptoms of a similar nature but these were less severe and not bothersome.  This has been going on since February and she trialed nonoperative therapy with splints but this has failed to improve her symptoms.  Today, she also complains of the right ring finger snapping.  And would like to get this addressed in the same setting.  Prior medical records were reviewed.    PMHx, PSurgHx, Fam Hx, Soc Hx, Meds, Allergies: Past Medical History:  Diagnosis Date  . Collagenous colitis   . GERD (gastroesophageal reflux disease)   . History of cataract   . History of colon cancer   . History of rosacea   . Hyperlipidemia   . Hypertension   . Lumbar degenerative disc disease   . OAB (overactive bladder)   . Osteopenia   . PONV (postoperative nausea and vomiting)     Past Surgical History:  Procedure Laterality Date  . HYSTERECTOMY  2002   TAH BSO  . COLONOSCOPY  10/30/2017   DIFFUSE MICROSCOPIC COLITIS/PHx Cp/Repeat 15yrs/MUS  . EGD  10/30/2017   GASTRIC POLYP STOMACH BODY ACUTE INFLAMMATION/No Repeat/MUS  . COLONOSCOPY  10/15/2020   Diverticulosis/Collagenous Colitis/PHx CC/Repeat 52yrs/CTL  . EGD  10/19/2021   Normal EGD biopsy/Gastritis/No repeat/TKT  . Rt hip endoscopic loose bodyr emoval, open gluteus medius tear, open trochanteric bursitis Right 08/29/2022   Dr. Tobie  . Right hip incision and drianage of deep abscess, right hip excisional debridement of muscle and bone Right 09/25/2022   Dr. Tobie  . EXTRACTION CATARACT EXTRACAPSULAR W/INSERTION INTRAOCULAR PROSTHESIS  Right 12/21/2023   Procedure: Right- LenSx - EXTRACTION CATARACT EXTRACAPSULAR WITH PHACO WITH INSERTION INTRAOCULAR PROSTHESIS;  Surgeon: Thurman Therisa Cohn, MD;  Location: [facility];  Service: Ophthalmology;  Laterality: Right;  . EXTRACTION CATARACT EXTRACAPSULAR W/INSERTION INTRAOCULAR PROSTHESIS Left 01/04/2024   Procedure: Left - LenSx - EXTRACTION CATARACT EXTRACAPSULAR WITH PHACO WITH INSERTION INTRAOCULAR PROSTHESIS;  Surgeon: Thurman Therisa Cohn, MD;  Location: [facility];  Service: Ophthalmology;  Laterality: Left;  . Hip open gluteus medius repair Left 01/29/2024   Dr. Tobie  . Lt hip open trochanteric bursectomy, open gluteus medius repair Left 01/29/2024   Dr. Tobie  . APPENDECTOMY    . CHOLECYSTECTOMY    . Colon cancer surgery    . COLONOSCOPY  05/04/2014 ?   PH Polyps  . TONSILLECTOMY      Family History  Problem Relation Age of Onset  . High blood pressure (Hypertension) Mother   . Liver cancer Mother   . Heart disease Father   . High blood pressure (Hypertension) Father   . Hyperlipidemia (Elevated cholesterol) Father   . Kidney cancer Sister   . Stroke Sister   . Diabetes type II Sister   . High blood pressure (Hypertension) Sister   . Diabetes type II Sister   . High blood pressure (Hypertension) Sister   . Heart disease Sister   . Hyperlipidemia (Elevated cholesterol) Sister   . High blood pressure (Hypertension) Sister   . Coronary Artery Disease (Blocked arteries around heart) Sister   . Obesity Daughter   .  Diabetes Daughter        prediabetic  . High blood pressure (Hypertension) Daughter   . Obesity Son   . Lung disease Son   . Anxiety Son   . High blood pressure (Hypertension) Son     Social History   Socioeconomic History  . Marital status: Married  Tobacco Use  . Smoking status: Never    Passive exposure: Never  . Smokeless tobacco: Never  Vaping Use  . Vaping status: Never Used  Substance and Sexual Activity  . Alcohol use: Yes     Comment: 1-2x per week  . Drug use: No  . Sexual activity: Yes    Birth control/protection: Surgical    Comment: hysterectomy  Social History Narrative   Marital Status- Married   Lives with husband   Employment- Retired   Exercise hx- Goes to gym 5x/weekly   Religious Affiliation- Catholic    Social Drivers of Health   Financial Resource Strain: Low Risk  (09/22/2024)   Overall Financial Resource Strain (CARDIA)   . Difficulty of Paying Living Expenses: Not hard at all  Food Insecurity: No Food Insecurity (09/22/2024)   Hunger Vital Sign   . Worried About Programme researcher, broadcasting/film/video in the Last Year: Never true   . Ran Out of Food in the Last Year: Never true  Transportation Needs: No Transportation Needs (09/22/2024)   PRAPARE - Transportation   . Lack of Transportation (Medical): No   . Lack of Transportation (Non-Medical): No  Housing Stability: Low Risk  (09/22/2024)   Housing Stability Vital Sign   . Unable to Pay for Housing in the Last Year: No   . Number of Times Moved in the Last Year: 0   . Homeless in the Last Year: No     Current Outpatient Medications  Medication Sig Dispense Refill  . alendronate (FOSAMAX) 70 MG tablet TAKE ONE TABLET BY MOUTH EVERY WEEK *TAKE ON AN EMPTY STOMACH WITH A FULL GLASS OF WATER , AVOID MINERAL OR WELL WATER . DO NOT EAT OR TAKE OTHER MEDICATIONS FOR AT LEAST 30 MINUTES AFTER DOSE. SIT OR STAND FOR AT LEAST 30 MINUTES AFTER DOSE* 12 tablet 1  . budesonide (ENTOCORT EC) 3 mg EC capsule TAKE 3 CAPSULES BY MOUTH IN THE MORNING 270 capsule 0  . calcium carbonate (TUMS E-X) 300 mg (750 mg) chewable tablet Take 300 mg of elemental by mouth every 2 (two) hours as needed for Heartburn    . calcium citrate-vitamin D3 (CITRACAL+D) 315 mg-5 mcg (200 unit) tablet Take 2 tablets by mouth once daily Takes 650 ng x 2 times    . cetirizine (ZYRTEC) 10 mg capsule Take 10 mg by mouth once daily as needed for Allergies    . chlorhexidine  (PERIDEX ) 0.12 %  solution Swish and spit    . ezetimibe (ZETIA) 10 mg tablet TAKE ONE TABLET BY MOUTH ONE TIME DAILY 30 tablet 2  . Herbal Supplement Osteo BiFlex- 2 tablets by mouth once daily    . Lactobacillus acidophilus (PROBIOTIC ORAL) Take 1 capsule by mouth once daily [employer] Capsul    . losartan-hydroCHLOROthiazide (HYZAAR) 50-12.5 mg tablet TAKE ONE TABLET BY MOUTH ONE TIME DAILY 90 tablet 1  . nitrofurantoin, macrocrystal-monohydrate, (MACROBID) 100 MG capsule     . omeprazole (PRILOSEC) 20 MG DR capsule TAKE ONE CAPSULE BY MOUTH ONE TIME DAILY 30 capsule 11  . solifenacin (VESICARE) 5 MG tablet Take 1 tablet (5 mg total) by mouth once daily 90 tablet 3  No current facility-administered medications for this visit.    No Known Allergies  Review of Systems: A 10+ ROS was performed, reviewed, and the pertinent orthopaedic findings are documented in the HPI.  I have reviewed and agree with the ROS captured by the CMA.    Physical Exam: BP 132/86   Ht 170.2 cm (5' 7)   Wt 68 kg (150 lb)   BMI 23.49 kg/m  General/Constitutional: No apparent distress: well-nourished and well developed. Eyes: Pupils equal, round with synchronous movement. Lymphatic: No palpable adenopathy. Respiratory: Patient has good chest rise and fall with inspiration and expiration.  All lung fields are clear to auscultation bilaterally.  There is no Rales, rhonchi or wheezes appreciated. Cardiovascular: Upon auscultation there is a regular rate and rhythm without any murmurs, rubs, gallops or heaves appreciated. Integumentary: No impressive skin lesions present, except as noted in detailed exam. Neuro/Psych: Normal mood and affect, oriented to person, place and time. Musculoskeletal: see exam below  Right Upper Extremity:  The patient has ulnar subluxation of the extensor tendon over the middle finger MCP joint with flexion.  The patient is able to actively overcome this with extension but it causes a painful  snap.  The patient also has ulnar subluxation of the extensor tendon over the ring finger MCP joint with flexion, although, this typically requires more of a flexion force.  She is also able to actively overcome this with extension causing reduction of the tendon over the center of the MCP joint.  Sensation is intact to light touch throughout.  Digits are warm and well-perfused.  Left upper extremity: Ulnar subluxation of the extensor tendon over the middle finger MCP joint with flexion.  There is active reduction with digit extension.  Sensation is intact to light touch throughout.  Digits are warm and well-perfused.  Imaging and Results: NA  Assessment & Plan: Brittany Franklin is a 73 y.o. female patient with right hand middle finger and ring finger sagittal band ruptures - We will plan for a right middle finger and ring finger sagittal band reconstruction.  The risk, benefits, alternatives to surgery were discussed with the patient including but not limited to infection, repair failure, injury to nearby structures, stiffness, pain - Discussed the expected postoperative course occluding approximately 3 to 4 weeks of immobilization followed by hand therapy  Also with a left middle finger sagittal band rupture - This is minimally symptomatic for the patient and she would like to continue to manage with benign neglect at this time  Will follow-up postoperatively  Jackquline CANDIE Barrack, MD [facility] Orthopaedics and Sports Medicine [address] [place], [place] [address] Phone: [phone]  This note was generated in part with voice recognition software; please excuse any typographical errors that were not detected and corrected.

## 2024-09-22 NOTE — Op Note (Signed)
 Date of procedure:  09/23/2024  Surgeon:  Jackquline CANDIE Barrack, MD   Procedure(s) performed:   Right middle finger sagittal band reconstruction (CPT [phone]) Right ring finger sagittal band reconstruction (CPT [phone]) Application of short arm splint (CPT [phone])  Assistant(s): none   Preoperative diagnosis:   Nontraumatic sagittal band rupture of extensor tendon, right M66.241    Postoperative diagnosis:   Radial sided sagittal band rupture of the middle finger Radial sided sagittal band attenuation of the ring finger   Procedure findings: Radial sided sagittal band rupture of the middle finger.  Radial sided sagittal band partial attenuation of the ring finger.  Anesthesia:  Regional + MAC   Estimated blood loss:  Minimal   Specimen:  None   Complications:  None apparent   Implants: None   Indications: 73 y.o. year old female with right middle and ring finger sagittal band ruptures .  We discussed operative treatment and explained the risks, benefits, and alternatives as well as the expected postoperative course.  The patient elected to proceed with surgery.   Procedure in detail:  The patient was met in the pre-op holding area and the right upper extremity was marked and the consent confirmed.  Anesthesia placed a short acting regional block.  The patient was then taken to the operating room and the hand table was attached to the stretcher.  Antibiotics were administered prior to incision.  All bony prominences were well padded.  The operative extremity was then prepped and draped in the usual sterile fashion.  Tourniquet was placed high on the operative extremity.  A timeout was performed confirming the correct patient, site, and procedure.   I began by making a longitudinal incision in line with the extensor tendons in between the ring and middle finger metacarpals.  A window was created in order to sufficiently expose both the EDC tendons of the ring and middle fingers.  I began by  addressing the middle finger sagittal band rupture.  The radial sagittal band was examined and appeared ruptured and scarred without any available tissue to primarily repair.  Thus, an approximate 4 cm strip of the middle finger EDC tendon with a width of approximately one third of the tendon was obtained creating a distally based tendon graft the level of the MCP joint.  3-0 Ethibond suture was placed at this division to prevent the tendon from stripping further.  The tendon graft was then looped underneath the deep transverse metacarpal ligament on the radial aspect of the third metacarpal.  It was then looped back over to the [facility] tendon and setting the appropriate tension in extension, a running interlocking suture 3-0 Ethibond suture was used to secure the graft to the [facility] tendon with the tendon centralized over the metacarpal head.  The middle finger was then flexed and the tendon remained centralized and slightly overcorrected radially.  I turned my attention to the ring finger.  Upon examination the radial sagittal band appeared to be intact but possibly slightly attenuated.  Upon stressing the sagittal band and flexion I cannot because the tendon to dislocate ulnarly.  However, the patient was able to demonstrate this to me in clinic. Therefore, I performed a partial release of the ulnar sagittal band as well as utilized a running interlocking suture with 3-0 Ethibond to imbricate the radial sagittal band.  This slightly overcorrected the EDC tendon and it lay slightly radial over the metacarpal head.  The finger was then stressed and put into extreme flexion and it  remained in this slightly overcorrected position.   The wound was irrigated with normal saline.  The incision was closed with 4-0 Nylon.  Sterile dressings were applied.  A volarly based short arm splint was applied with the fingers in extension.  The patient was transferred to the PACU in stable condition.  Counts were correct x2.    Plan: Will return to clinic in 2 weeks for a wound check and suture removal The patient will remain immobilized with her fingers in extension for 3 weeks At that point, she will be referred to occupational therapy to begin working on range of motion

## 2024-09-23 ENCOUNTER — Ambulatory Visit: Payer: Self-pay | Admitting: Anesthesiology

## 2024-09-23 ENCOUNTER — Other Ambulatory Visit: Payer: Self-pay

## 2024-09-23 ENCOUNTER — Ambulatory Visit: Admission: RE | Admit: 2024-09-23 | Discharge: 2024-09-23 | Disposition: A | Discharge status: Home / Self Care

## 2024-09-23 ENCOUNTER — Encounter: Admission: RE | Disposition: A | Discharge status: Home / Self Care | Payer: Self-pay | Source: Home / Self Care

## 2024-09-23 DIAGNOSIS — M66241 Spontaneous rupture of extensor tendons, right hand: Secondary | ICD-10-CM | POA: Insufficient documentation

## 2024-09-23 DIAGNOSIS — K449 Diaphragmatic hernia without obstruction or gangrene: Secondary | ICD-10-CM | POA: Diagnosis not present

## 2024-09-23 DIAGNOSIS — K219 Gastro-esophageal reflux disease without esophagitis: Secondary | ICD-10-CM | POA: Diagnosis not present

## 2024-09-23 DIAGNOSIS — Z7952 Long term (current) use of systemic steroids: Secondary | ICD-10-CM | POA: Diagnosis not present

## 2024-09-23 DIAGNOSIS — I10 Essential (primary) hypertension: Secondary | ICD-10-CM | POA: Diagnosis not present

## 2024-09-23 DIAGNOSIS — Z79899 Other long term (current) drug therapy: Secondary | ICD-10-CM | POA: Insufficient documentation

## 2024-09-23 HISTORY — DX: Other hereditary and idiopathic neuropathies: G60.8

## 2024-09-23 HISTORY — PX: REPAIR EXTENSOR TENDON: SHX5382

## 2024-09-23 HISTORY — DX: Myoneural disorder, unspecified: G70.9

## 2024-09-23 SURGERY — REPAIR, TENDON, EXTENSOR
Anesthesia: General | Site: Finger | Laterality: Right

## 2024-09-23 MED ORDER — SCOPOLAMINE 1 MG/3DAYS TD PT72
1.0000 | MEDICATED_PATCH | TRANSDERMAL | Status: DC
  Administered 2024-09-23: 1 mg via TRANSDERMAL

## 2024-09-23 MED ORDER — MIDAZOLAM HCL (PF) 2 MG/2ML IJ SOLN
2.0000 mg | INTRAMUSCULAR | Status: DC | PRN
  Administered 2024-09-23: 2 mg via INTRAVENOUS

## 2024-09-23 MED ORDER — OXYCODONE HCL 5 MG PO TABS
ORAL_TABLET | ORAL | Status: AC
  Filled 2024-09-23: qty 1

## 2024-09-23 MED ORDER — BUPIVACAINE HCL (PF) 0.5 % IJ SOLN
INTRAMUSCULAR | Status: DC | PRN
  Administered 2024-09-23: 17 mL via PERINEURAL

## 2024-09-23 MED ORDER — PROPOFOL 500 MG/50ML IV EMUL
INTRAVENOUS | Status: DC | PRN
  Administered 2024-09-23: 100 ug/kg/min via INTRAVENOUS

## 2024-09-23 MED ORDER — SCOPOLAMINE 1 MG/3DAYS TD PT72
MEDICATED_PATCH | TRANSDERMAL | Status: AC
  Filled 2024-09-23: qty 1

## 2024-09-23 MED ORDER — LIDOCAINE HCL (PF) 2 % IJ SOLN
INTRAMUSCULAR | Status: DC | PRN
  Administered 2024-09-23: 2 mL via INTRADERMAL

## 2024-09-23 MED ORDER — AMISULPRIDE (ANTIEMETIC) 5 MG/2ML IV SOLN: 10.0000 mg | Freq: Once | INTRAVENOUS | Status: DC

## 2024-09-23 MED ORDER — ONDANSETRON HCL 4 MG/2ML IJ SOLN
4.0000 mg | Freq: Once | INTRAMUSCULAR | Status: AC
  Administered 2024-09-23: 4 mg via INTRAVENOUS

## 2024-09-23 MED ORDER — LIDOCAINE-EPINEPHRINE 1 %-1:100000 IJ SOLN
INTRAMUSCULAR | Status: AC
  Filled 2024-09-23: qty 1

## 2024-09-23 MED ORDER — PROPOFOL 10 MG/ML IV BOLUS
INTRAVENOUS | Status: AC
  Filled 2024-09-23: qty 20

## 2024-09-23 MED ORDER — BUPIVACAINE HCL (PF) 0.5 % IJ SOLN
INTRAMUSCULAR | Status: AC
  Filled 2024-09-23: qty 30

## 2024-09-23 MED ORDER — PROPOFOL 10 MG/ML IV BOLUS
INTRAVENOUS | Status: AC
Start: 2024-09-23 — End: 2024-09-23
  Filled 2024-09-23: qty 20

## 2024-09-23 MED ORDER — LACTATED RINGERS IV SOLN: INTRAVENOUS | Status: DC

## 2024-09-23 MED ORDER — HYDRALAZINE HCL 20 MG/ML IJ SOLN
INTRAMUSCULAR | Status: DC | PRN
  Administered 2024-09-23: 5 mg via INTRAVENOUS

## 2024-09-23 MED ORDER — LIDOCAINE HCL 2 % IJ SOLN
INTRAMUSCULAR | Status: AC
  Filled 2024-09-23: qty 2

## 2024-09-23 MED ORDER — 0.9 % SODIUM CHLORIDE (POUR BTL) OPTIME
TOPICAL | Status: DC | PRN
  Administered 2024-09-23: 500 mL

## 2024-09-23 MED ORDER — MIDAZOLAM HCL 2 MG/2ML IJ SOLN
INTRAMUSCULAR | Status: AC
  Filled 2024-09-23: qty 2

## 2024-09-23 MED ORDER — OXYCODONE HCL 5 MG PO TABS: 5.0000 mg | ORAL_TABLET | Freq: Four times a day (QID) | ORAL | 0 refills | Status: AC | PRN

## 2024-09-23 MED ORDER — LIDOCAINE-EPINEPHRINE 1 %-1:100000 IJ SOLN
INTRAMUSCULAR | Status: DC | PRN
  Administered 2024-09-23: 6 mL

## 2024-09-23 MED ORDER — CEFAZOLIN SODIUM-DEXTROSE 2-4 GM/100ML-% IV SOLN
2.0000 g | INTRAVENOUS | Status: AC
  Administered 2024-09-23: 2 g via INTRAVENOUS

## 2024-09-23 MED ORDER — CEFAZOLIN SODIUM-DEXTROSE 2-3 GM-%(50ML) IV SOLR
INTRAVENOUS | Status: AC
  Filled 2024-09-23: qty 50

## 2024-09-23 MED ORDER — FENTANYL CITRATE (PF) 100 MCG/2ML IJ SOLN: 100.0000 ug | Freq: Once | INTRAMUSCULAR | Status: DC

## 2024-09-23 MED ORDER — OXYCODONE HCL 5 MG PO TABS
10.0000 mg | ORAL_TABLET | Freq: Once | ORAL | Status: AC
Start: 2024-09-23 — End: 2024-09-23
  Administered 2024-09-23: 5 mg via ORAL

## 2024-09-23 MED ORDER — ONDANSETRON HCL 4 MG/2ML IJ SOLN
INTRAMUSCULAR | Status: AC
  Filled 2024-09-23: qty 2

## 2024-09-23 MED ORDER — DEXAMETHASONE SOD PHOSPHATE PF 10 MG/ML IJ SOLN
INTRAMUSCULAR | Status: DC | PRN
  Administered 2024-09-23: 10 mg via PERINEURAL

## 2024-09-23 SURGICAL SUPPLY — 18 items
BLADE MINI RND TIP GREEN BEAV (BLADE) ×1 IMPLANT
BNDG COHESIVE 1X5 TAN STRL LF (GAUZE/BANDAGES/DRESSINGS) IMPLANT
BNDG ELASTIC 4X5.8 VLCR NS LF (GAUZE/BANDAGES/DRESSINGS) ×1 IMPLANT
BNDG ESMARCH 4X12 STRL LF (GAUZE/BANDAGES/DRESSINGS) ×1 IMPLANT
CHLORAPREP W/TINT 26 (MISCELLANEOUS) ×1 IMPLANT
CORD BIP STRL DISP 12FT (MISCELLANEOUS) ×1 IMPLANT
COVER LIGHT HANDLE 1/PK (MISCELLANEOUS) ×2 IMPLANT
CUFF TOURN SGL QUICK 18X4 (TOURNIQUET CUFF) IMPLANT
GAUZE SPONGE 4X4 12PLY STRL (GAUZE/BANDAGES/DRESSINGS) ×1 IMPLANT
GAUZE XEROFORM 1X8 LF (GAUZE/BANDAGES/DRESSINGS) ×1 IMPLANT
GOWN STRL REIN LRG LVL4 (GOWNS) ×1 IMPLANT
GOWN STRL REUS W/ TWL LRG LVL3 (GOWN DISPOSABLE) ×1 IMPLANT
NS IRRIG 500ML POUR BTL (IV SOLUTION) ×1 IMPLANT
PACK EXTREMITY ARMC (MISCELLANEOUS) ×1 IMPLANT
PADDING CAST BLEND 4X4 STRL (MISCELLANEOUS) IMPLANT
SPLINT PLASTER CAST FAST 4X15 (CAST SUPPLIES) IMPLANT
SUT ETHIBOND 3-0 EXTR (SUTURE) IMPLANT
SUT ETHILON 4 0 PS 2 18 (SUTURE) ×1 IMPLANT

## 2024-09-23 NOTE — Anesthesia Postprocedure Evaluation (Signed)
 Anesthesia Post Note  Patient: Brittany Franklin  Procedure(s) Performed: REPAIR, TENDON, EXTENSOR (Right: Finger)  Patient location during evaluation: PACU Anesthesia Type: General Level of consciousness: awake and alert Pain management: pain level controlled Vital Signs Assessment: post-procedure vital signs reviewed and stable Respiratory status: spontaneous breathing, nonlabored ventilation, respiratory function stable and patient connected to nasal cannula oxygen Cardiovascular status: blood pressure returned to baseline and stable Postop Assessment: no apparent nausea or vomiting Anesthetic complications: no   No notable events documented.   Last Vitals:  Vitals:   09/23/24 1415 09/23/24 1430  BP: (!) 172/86 (!) 144/72  Pulse: 96 78  Resp: 12 15  Temp:  (!) 36.1 C  SpO2: 100% 100%    Last Pain:  Vitals:   09/23/24 1430  TempSrc:   PainSc: 4                  M[place]au [place]ron[place] C Torian Quintero

## 2024-09-23 NOTE — Discharge Instructions (Signed)
 Discharge Instructions After Surgery   Dressings   Do not remove your dressing. Keep it clean and dry until your follow-up visit.   It is okay to have a small spot of blood on your dressing as long as it does not keep getting bigger.   Cover the dressing and keep it dry in the shower or bath. Keep your arm up and out of the water.   Diet   Return to your regular diet as tolerated.   Activity   If your fingers are free, work on making a full fist and straightening out the fingers within the limits of your splint.  Move other joints such as your elbow or shoulder multiple times a day to prevent getting stiff.  Do not try to move joints that are within a splint.  No lifting, carrying, pushing, or pulling with your arm until your follow-up visit. You may use your hand or fingers for simple things like writing, typing, eating, and brushing your teeth.   If you were given a sling, wear it while your hand or arm is numb. You can stop wearing the sling when you have feeling and strength back in your hand or arm. You may also use the sling as needed to elevate, protect, or support your arm until your follow-up visit.   Pain   The first 48 hours after surgery can hurt the most. You should use the different types of pain medicine along with rest and elevation to help keep the pain manageable.   NSAIDS such as ibuprofen  (Advil  or Motrin ) or naproxen (Aleve) can help with pain and swelling.   Acetaminophen  (Tylenol ) can be used with NSAIDs. Do not take more than 3000 mg of Tylenol  in a 24-hour period.   Narcotic pain medicine such as hydrocodone or oxycodone , should be used as prescribed. Stop taking as soon as possible. Take with food to help prevent nausea. Do not drink alcohol or drive while taking narcotics. Be aware that hydrocodone tablets often already contain acetaminophen  (Tylenol ).  Elevating your operative extremity above the level of your heart will help with pain and swelling. You can  rest your arm on several pillows for support while sleeping or resting.   Constipation   Narcotic pain medicines, changes in eating and drinking, and less activity may cause constipation after surgery.   Over-the-counter stool softeners like docusate (Colace), Senna, or Miralax can help. Follow the directions on the bottle.   Stay hydrated and try to eat high fiber foods.   When to call your surgeon   If your dressing gets wet or very dirty.   Fever more than 101 F.   Incision that is very red, swollen, draining pus, or feels hot.   Severe pain, not getting better with pain medicine.   Severe swelling, even with elevation.   If your fingers become cool, cold, or dark in color.   Bleeding that is getting bigger or soaking through the dressing.   Severe nausea and vomiting.   Problems using the bathroom or no bowel movement for 2-3 days.   For questions or concerns, please call [phone].  Jackquline CANDIE Barrack, MD [facility]

## 2024-09-23 NOTE — Transfer of Care (Signed)
 Immediate Anesthesia Transfer of Care Note  Patient: Brittany Franklin  Procedure(s) Performed: REPAIR, TENDON, EXTENSOR (Right: Finger)  Patient Location: PACU  Anesthesia Type: General  Level of Consciousness: awake, alert  and patient cooperative  Airway and Oxygen Therapy: Patient Spontanous Breathing and Patient connected to supplemental oxygen  Post-op Assessment: Post-op Vital signs reviewed, Patient's Cardiovascular Status Stable, Respiratory Function Stable, Patent Airway and No signs of Nausea or vomiting  Post-op Vital Signs: Reviewed and stable  Complications: No notable events documented.

## 2024-09-23 NOTE — Interval H&P Note (Signed)
 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  The various methods of treatment have been discussed with the patient and family. After consideration of risks, benefits and other options for treatment, the patient has consented to  Procedure(s) with comments: REPAIR, TENDON, EXTENSOR (Right) - Right middle finger sagittal band reconstruction and right ring finger sagittal band reconstruction as a surgical intervention.  The patient's history has been reviewed, patient examined, no change in status, stable for surgery.  I have reviewed the patient's chart and labs.  Questions were answered to the patient's satisfaction.     [name]

## 2024-09-23 NOTE — Anesthesia Procedure Notes (Signed)
 Anesthesia Regional Block: Supraclavicular block   Pre-Anesthetic Checklist: , timeout performed,  Correct Patient, Correct Site, Correct Laterality,  Correct Procedure, Correct Position, site marked,  Risks and benefits discussed,  Surgical consent,  Pre-op evaluation,  At surgeon's request and post-op pain management  Laterality: Right and Upper  Prep: chloraprep       Needles:  Injection technique: Single-shot  Needle Type: Echogenic Stimulator Needle      Needle Gauge: 21     Additional Needles:   Procedures:, nerve stimulator,,, ultrasound used (permanent image in chart),,     Nerve Stimulator or Paresthesia:  Response: bicep contraction, 0.45 mA  Additional Responses:   Narrative:  Start time: 09/23/2024 11:29 AM End time: 09/23/2024 11:39 AM Injection made incrementally with aspirations every 5 mL.  Performed by: Personally  Anesthesiologist: Ola Donny BROCKS, MD  Additional Notes: Functioning IV was confirmed and monitors applied.  Sterile prep and drape,hand hygiene and sterile gloves were used.Ultrasound guidance: relevant anatomy identified, needle position confirmed, local anesthetic spread visualized around nerve(s)., vascular puncture avoided.  Image printed for medical record.  Negative aspiration and negative test dose prior to incremental administration of local anesthetic. The patient tolerated the procedure well. Vitals signs recorded in RN notes. Also intercostobrachial block at same time.

## 2024-10-14 NOTE — Progress Notes (Signed)
    ORTHOPAEDIC SURGERY- CLINIC NOTE  Surgery: Right middle and ring finger radial sagittal band reconstructions; 09/23/2024  History of Present Illness:  Brittany Franklin is a 73 y.o. female who presents today for a postoperative visit s/p the above procedure.  He is doing well.  Pain is well-controlled.  She has been compliant with her splint.  No significant complaints today  Physical Exam: BP 132/86   Wt 67.2 kg (148 lb 3.2 oz)   BMI 23.21 kg/m  General/Constitutional: No apparent distress: well-nourished and well developed. Eyes: Pupils equal, round with synchronous movement. Neuro/Psych: Normal mood and affect, oriented to person, place and time. Musculoskeletal: see exam below  Right upper Extremity: Incision is clean, dry, intact. No significant erythema, drainage, or other signs of infection. Sutures are in place.  There is some ecchymoses and swelling about the incision and to the distal fingertips.  Sensation is intact to light touch throughout.  The fingers are warm and well-perfused.  Imaging and Results: NA  Assessment & Plan: Rocky Rishel is a 73 y.o. female patient who is s/p right middle and ring finger radial sagittal reconstructions on 09/23/2024 - Sutures removed in clinic today - 3 weeks total of immobilization with fingers in extension - Referral to occupational therapy for custom orthosis and to begin therapy protocol - Follow-up in 4 weeks   Jackquline CANDIE Barrack, MD [facility] [address] [place], [place] [address] Phone: [phone]  This note was generated in part with voice recognition software; please excuse any typographical errors that were not detected and corrected.

## 2024-10-17 ENCOUNTER — Encounter: Payer: Self-pay | Admitting: Occupational Therapy

## 2024-10-17 ENCOUNTER — Ambulatory Visit: Admitting: Occupational Therapy

## 2024-10-17 DIAGNOSIS — M79642 Pain in left hand: Secondary | ICD-10-CM | POA: Insufficient documentation

## 2024-10-17 DIAGNOSIS — M66241 Spontaneous rupture of extensor tendons, right hand: Secondary | ICD-10-CM | POA: Insufficient documentation

## 2024-10-17 DIAGNOSIS — M66242 Spontaneous rupture of extensor tendons, left hand: Secondary | ICD-10-CM | POA: Diagnosis present

## 2024-10-17 DIAGNOSIS — M79641 Pain in right hand: Secondary | ICD-10-CM | POA: Diagnosis present

## 2024-10-17 NOTE — Therapy (Signed)
 OUTPATIENT OCCUPATIONAL THERAPY ORTHO EVALUATION  Patient Name: Brittany Franklin MRN: [id number] DOB:10-16-1951, 73 y.o., female Today's Date: 10/17/2024  PCP: Edsel Pepper, PA REFERRING PROVIDER: Jackquline Barrack, MD  END OF SESSION:  OT End of Session - 10/17/24 0817     Visit Number 1    Number of Visits 16    Date for Recertification  12/12/24    OT Start Time 0815    OT Stop Time 0900    OT Time Calculation (min) 45 min    Activity Tolerance Patient tolerated treatment well    Behavior During Therapy WFL for tasks assessed/performed          Past Medical History:  Diagnosis Date   Arthritis    Cancer (HCC)    COLON CANCER   Colitis    Colon cancer (HCC)    GERD (gastroesophageal reflux disease)    GERD without esophagitis    History of colon polyps    History of hiatal hernia    small   Hyperlipidemia    Hypertension    Neuromuscular disorder (HCC)    idiopathic neuropathy   Numbness and tingling in both hands    Osteopenia    Osteopenia of multiple sites    PONV (postoperative nausea and vomiting)    very bad nausea after colon surgery-- FENTANYL  MADE HER SICK   Rosacea    Sensory polyneuropathy    Past Surgical History:  Procedure Laterality Date   ABDOMINAL HYSTERECTOMY     BREAST BIOPSY Right 07/19/2022   Stereo right bx, Ribbon Clip - CYSTIC PAPILLARY APOCRINE METAPLASIA.   CHOLECYSTECTOMY     COLON SURGERY  2012   COLONOSCOPY WITH PROPOFOL  N/A 10/30/2017   Procedure: COLONOSCOPY WITH PROPOFOL ;  Surgeon: Gaylyn Gladis PENNER, MD;  Location: [facility];  Service: Endoscopy;  Laterality: N/A;   COLONOSCOPY WITH PROPOFOL  N/A 10/15/2020   Procedure: COLONOSCOPY WITH PROPOFOL ;  Surgeon: Maryruth Ole DASEN, MD;  Location: [facility] ENDOSCOPY;  Service: Endoscopy;  Laterality: N/A;   ESOPHAGOGASTRODUODENOSCOPY (EGD) WITH PROPOFOL  N/A 10/30/2017   Procedure: ESOPHAGOGASTRODUODENOSCOPY (EGD) WITH PROPOFOL ;  Surgeon: Gaylyn Gladis PENNER, MD;  Location:  [facility] ENDOSCOPY;  Service: Endoscopy;  Laterality: N/A;   ESOPHAGOGASTRODUODENOSCOPY (EGD) WITH PROPOFOL  N/A 10/19/2021   Procedure: ESOPHAGOGASTRODUODENOSCOPY (EGD) WITH PROPOFOL ;  Surgeon: Toledo, Ladell POUR, MD;  Location: [facility] ENDOSCOPY;  Service: Gastroenterology;  Laterality: N/A;   EYE SURGERY Bilateral    January 17and January 04, 2024   REPAIR EXTENSOR TENDON Right 09/23/2024   Procedure: REPAIR, TENDON, EXTENSOR;  Surgeon: Barrack Jackquline RAMAN, MD;  Location: [facility];  Service: Orthopedics;  Laterality: Right;  Right middle finger sagittal band reconstruction; Right ring finger sagittal band reconstruction   TONSILLECTOMY     as achild   There are no active problems to display for this patient.   ONSET DATE: 09/23/24  REFERRING DIAG: Sagital Band Reconstructive Surgery  THERAPY DIAG:  Nontraumatic rupture of sagittal band of extensor tendon of right upper extremity  Rationale for Evaluation and Treatment: Rehabilitation  SUBJECTIVE:   SUBJECTIVE STATEMENT: I have no pain in my hand but a little in my shoulder. I even put my own bra on this morning, I asked Chat GPT how to do it.  Pt accompanied by: self and significant other  PERTINENT HISTORY:  Brittany Franklin is a 73 y.o. female patient who is s/p right middle and ring finger radial sagittal reconstructions on 09/23/2024. Previously referred to OT on 08/11/24 for orthosis following nontraumatic sagittal band rupture.  10/15/24 ortho visit:  - Sutures removed in clinic today - 3 weeks total of immobilization with fingers in extension - Referral to occupational therapy for custom orthosis and to begin therapy protocol - Follow-up in 4 weeks  PRECAUTIONS: None  RED FLAGS: None   WEIGHT BEARING RESTRICTIONS: No  PAIN:  Are you having pain? Yes: NPRS scale: 2/10 Pain location: R shoulder Pain description: ache Aggravating factors: lifting R arm Relieving factors: rest, tylenol   FALLS: Has patient  fallen in last 6 months? No  LIVING ENVIRONMENT: Lives with: lives with their spouse  PLOF:  Patient is a retired [employer] information [employer] manager.  She is very active working out-doing spin class, total body workout, working out 2 times a week with a trainer (currently focused on lower body only).   PATIENT GOALS: to return to exercise   NEXT MD VISIT: 11/07/24  OBJECTIVE:  Note: Objective measures were completed at Evaluation unless otherwise noted.  HAND DOMINANCE: Right  ADLs: Overall ADLs: buttoning clothes, cutting food  FUNCTIONAL OUTCOME MEASURES: PRHE:   UPPER EXTREMITY ROM:     Active ROM Right eval Left eval  Shoulder flexion    Shoulder abduction    Shoulder adduction    Shoulder extension    Shoulder internal rotation    Shoulder external rotation    Elbow flexion    Elbow extension    Wrist flexion    Wrist extension    Wrist ulnar deviation    Wrist radial deviation    Wrist pronation    Wrist supination    (Blank rows = not tested)  Active ROM Right eval Left eval  Thumb MCP (0-60)    Thumb IP (0-80)    Thumb Radial abd/add (0-55)     Thumb Palmar abd/add (0-45)     Thumb Opposition to Small Finger     Index MCP (0-90)     Index PIP (0-100)     Index DIP (0-70)      Long MCP (0-90)      Long PIP (0-100)      Long DIP (0-70)      Ring MCP (0-90)      Ring PIP (0-100)      Ring DIP (0-70)      Little MCP (0-90)      Little PIP (0-100)      Little DIP (0-70)      (Blank rows = not tested)   HAND FUNCTION: Grip strength: Right:   lbs; Left:   lbs, Lateral pinch: Right:   lbs, Left:   lbs, and 3 point pinch: Right:   lbs, Left:   lbs Eval not tested  COORDINATION: 9 Hole Peg test: Right:   sec; Left:   sec Eval not tested  SENSATION: WFL  EDEMA: moderate edema   COGNITION: Overall cognitive status: Within functional limits for tasks assessed  OBSERVATIONS: bruising along dorsal aspect of hand, scar appears closed with sutures  removed.    TREATMENT DATE: 10/17/24  Arrives wearing wrist and finger extension splint - plan to continue to wear at nighttime to protect hand. Fabricated custom yolk splint for R 3rd and 4th digits to prevent MCP flexion, to be worn at nighttime and with all activity. Patient was educated on donning and doffing as well as wearing of splint. Plan to follow up next session for splint check, anticipate adjustment as swelling decreases. Patient verbalized and demonstrated understanding. May remove splint x3 daily with hand/forearm supported flat on table/pillow.   Pt reports R shoulder pain 2/10, educated on adaptations to minimize overuse.   Education on contrast bath and HEP with videos taken and handout provided.   Modality: Contrast bath 8 min R hand to reduce swelling prior to splint fabrication and ROM exercises   PROM 2nd and 5th digits composite flexion AROM with splint on DIP/PIP flexion all digits Educated on opposition to index finger only with splint on.   PATIENT EDUCATION: Education details: findings of eval and HEP  Person educated: Patient Education method: Explanation, Demonstration, Tactile cues, Verbal cues, and Handouts Education comprehension: verbalized understanding, returned demonstration, verbal cues required, and needs further education   HOME EXERCISE PROGRAM: PROM 2nd and 5th digits composite flexion.  AROM with splint on DIP/PIP flexion all digits  GOALS: Goals reviewed with patient? Yes      SHORT TERM GOALS: Target date: 10/31/24   Pt will obtain protective, custom orthotic. Baseline: custom yolk splint made on eval  Goal status: MET  2.  Patient will independently don/doff and wear hand-based MCP extension splint on R third and fourth digits to avoid composite flexion and MCP flexion during activities.  Baseline: assist to  don and educated on wearing schedule  Goal status: INITIAL  3.  Pt will decrease pain at worst from 2/10 to 0/10 to have better sleep and occupational participation in daily roles. Baseline: 2/10  Goal status: INITIAL  LONG TERM GOALS: Target date: 12/12/24  Pt will demo/state understanding of initial HEP to improve pain levels and prerequisite motion. Baseline: initiated at eval with MAX cueing Goal status: INITIAL  2.  Pt will improve grip strength in R hand from unable to test to at least 40 lbs for functional use at home and in IADLs. Baseline: unable to test Goal status: INITIAL  3.  R hand 3-point pinch improved from unable to test to 5 lb pain free in pain-free for patient to initiate cutting meat. Baseline: unable to test Goal status: INITIAL  4.  Pt will decrease tenderness over scar and scar adhesions to have full range of motion at the digit flexion extension Baseline: Patient to be in [facility] block splint for 3 weeks. Goal status: INITIAL   ASSESSMENT: ASSESSMENT:  CLINICAL IMPRESSION: Patient seen today for occupational therapy evaluation following right middle and ring finger radial sagittal reconstructions on 09/23/2024. Fabricated custom yolk splint for R 3rd and 4th digits to prevent MCP flexion, to be worn at nighttime and with all activity. Reviewed PROM 3rd and 4th digits DIP/PIP flexion in splint and AROM composite flexion 2nd and 5th digits. Opposition to index finger only with splint on. Pt unable to extend 3rd and 4th digits from tabletop, flexion limited by swelling, plan to assess ROM next session. Deficits noted in ROM, strength, and edema. Pt will benefit from OT services for splint fabrication, RUE ROM exercises, and pain management strategies in order to improve engagement during basic ADL/IADL tasks.     PERFORMANCE DEFICITS: in functional skills including ADLs, IADLs,  ROM, strength, pain, flexibility, decreased knowledge of use of DME, and UE functional use,    and psychosocial skills including environmental adaptation and routines and behaviors.   IMPAIRMENTS: are limiting patient from ADLs, IADLs, rest and sleep, play, leisure, and social participation.   COMORBIDITIES: has no other co-morbidities that affects occupational performance. Patient will benefit from skilled OT to address above impairments and improve overall function.  MODIFICATION OR ASSISTANCE TO COMPLETE EVALUATION: No modification of tasks or assist necessary to complete an evaluation.  OT OCCUPATIONAL PROFILE AND HISTORY: Problem focused assessment: Including review of records relating to presenting problem.  CLINICAL DECISION MAKING: LOW - limited treatment options, no task modification necessary  REHAB POTENTIAL: Good for goals  EVALUATION COMPLEXITY: Low        PLAN:  OT FREQUENCY: 1-2x/week  OT DURATION: 8 weeks  PLANNED INTERVENTIONS: 97168 OT Re-evaluation, 97535 self care/ADL training, 02889 therapeutic exercise, 97530 therapeutic activity, 97140 manual therapy, 97018 paraffin, 02960 fluidotherapy, 97034 contrast bath, 97760 Orthotic Initial, [id number] Orthotic/Prosthetic subsequent, scar mobilization, passive range of motion, and patient/family education  RECOMMENDED OTHER SERVICES: none  CONSULTED AND AGREED WITH PLAN OF CARE: Patient  PLAN FOR NEXT SESSION: splint check   Elston Slot, M.S. OTR/L  10/17/24, 8:23 AM  ascom [phone]   Elston JINNY Slot, OT 10/17/2024, 8:23 AM

## 2024-10-20 ENCOUNTER — Ambulatory Visit: Payer: Self-pay | Admitting: Occupational Therapy

## 2024-10-20 DIAGNOSIS — M79641 Pain in right hand: Secondary | ICD-10-CM

## 2024-10-20 DIAGNOSIS — M66241 Spontaneous rupture of extensor tendons, right hand: Secondary | ICD-10-CM | POA: Diagnosis not present

## 2024-10-20 DIAGNOSIS — M66242 Spontaneous rupture of extensor tendons, left hand: Secondary | ICD-10-CM

## 2024-10-20 NOTE — Therapy (Signed)
 OUTPATIENT OCCUPATIONAL THERAPY ORTHO TREATMENT  Patient Name: Brittany Franklin MRN: [id number] DOB:10-01-1951, 73 y.o., female Today's Date: 10/20/2024  PCP: Edsel Pepper, PA REFERRING PROVIDER: Jackquline Barrack, MD  END OF SESSION:  OT End of Session - 10/20/24 0819     Visit Number 2    Number of Visits 16    Date for Recertification  12/12/24    OT Start Time 0818    OT Stop Time 0852    OT Time Calculation (min) 34 min    Activity Tolerance Patient tolerated treatment well    Behavior During Therapy [facility] for tasks assessed/performed          Past Medical History:  Diagnosis Date   Arthritis    Cancer (HCC)    COLON CANCER   Colitis    Colon cancer (HCC)    GERD (gastroesophageal reflux disease)    GERD without esophagitis    History of colon polyps    History of hiatal hernia    small   Hyperlipidemia    Hypertension    Neuromuscular disorder (HCC)    idiopathic neuropathy   Numbness and tingling in both hands    Osteopenia    Osteopenia of multiple sites    PONV (postoperative nausea and vomiting)    very bad nausea after colon surgery-- FENTANYL  MADE HER SICK   Rosacea    Sensory polyneuropathy    Past Surgical History:  Procedure Laterality Date   ABDOMINAL HYSTERECTOMY     BREAST BIOPSY Right 07/19/2022   Stereo right bx, Ribbon Clip - CYSTIC PAPILLARY APOCRINE METAPLASIA.   CHOLECYSTECTOMY     COLON SURGERY  2012   COLONOSCOPY WITH PROPOFOL  N/A 10/30/2017   Procedure: COLONOSCOPY WITH PROPOFOL ;  Surgeon: Gaylyn Gladis PENNER, MD;  Location: [facility] ENDOSCOPY;  Service: Endoscopy;  Laterality: N/A;   COLONOSCOPY WITH PROPOFOL  N/A 10/15/2020   Procedure: COLONOSCOPY WITH PROPOFOL ;  Surgeon: Maryruth Ole DASEN, MD;  Location: [facility] ENDOSCOPY;  Service: Endoscopy;  Laterality: N/A;   ESOPHAGOGASTRODUODENOSCOPY (EGD) WITH PROPOFOL  N/A 10/30/2017   Procedure: ESOPHAGOGASTRODUODENOSCOPY (EGD) WITH PROPOFOL ;  Surgeon: Gaylyn Gladis PENNER, MD;  Location:  [facility] ENDOSCOPY;  Service: Endoscopy;  Laterality: N/A;   ESOPHAGOGASTRODUODENOSCOPY (EGD) WITH PROPOFOL  N/A 10/19/2021   Procedure: ESOPHAGOGASTRODUODENOSCOPY (EGD) WITH PROPOFOL ;  Surgeon: Toledo, Ladell POUR, MD;  Location: [facility] ENDOSCOPY;  Service: Gastroenterology;  Laterality: N/A;   EYE SURGERY Bilateral    January 17and January 04, 2024   REPAIR EXTENSOR TENDON Right 09/23/2024   Procedure: REPAIR, TENDON, EXTENSOR;  Surgeon: Barrack Jackquline RAMAN, MD;  Location: [facility] SURGERY CNTR;  Service: Orthopedics;  Laterality: Right;  Right middle finger sagittal band reconstruction; Right ring finger sagittal band reconstruction   TONSILLECTOMY     as achild   There are no active problems to display for this patient.   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 female patient who is s/p right middle and ring finger radial sagittal reconstructions on 09/23/2024. Previously referred to OT on 08/11/24 for orthosis following nontraumatic sagittal band rupture.  10/15/24  ortho visit:  - Sutures removed in clinic today - 3 weeks total of immobilization with fingers in extension - Referral to occupational therapy for custom orthosis and to begin therapy protocol - Follow-up in 4 weeks  PRECAUTIONS: None  RED FLAGS: None   WEIGHT BEARING RESTRICTIONS: No  PAIN:  Are you having pain?no pain  FALLS: Has patient fallen in last 6 months? No  LIVING ENVIRONMENT: Lives with: lives with their spouse  PLOF:   Patient is a retired tourist information centre manager.  She is very active working out-doing spin class, total body workout, working out 2 times a week with a trainer (currently focused on lower body only).   PATIENT GOALS: to return to exercise   NEXT MD VISIT: 11/07/24  OBJECTIVE:  Note: Objective measures were completed at Evaluation unless otherwise noted.  HAND DOMINANCE: Right  ADLs: Overall ADLs: buttoning clothes, cutting food  FUNCTIONAL OUTCOME MEASURES: PRHE:   UPPER EXTREMITY ROM:     Active ROM Right 10/20/24 Left eval  Shoulder flexion    Shoulder abduction    Shoulder adduction    Shoulder extension    Shoulder internal rotation    Shoulder external rotation    Elbow flexion    Elbow extension    Wrist flexion 55   Wrist extension 65   Wrist ulnar deviation    Wrist radial deviation    Wrist pronation    Wrist supination    (Blank rows = not tested)  Active ROM Right 10/20/24 Left eval  Thumb MCP (0-60)    Thumb IP (0-80)    Thumb Radial abd/add (0-55)     Thumb Palmar abd/add (0-45)     Thumb Opposition to Small Finger     Index MCP (0-90) 80P    Index PIP (0-100) 90    Index DIP (0-70) 70     Long MCP (0-90)      Long PIP (0-100) 80     Long DIP (0-70)  70    Ring MCP (0-90)      Ring PIP (0-100) 85     Ring DIP (0-70)  70    Little MCP (0-90) 90 P    Little PIP (0-100) 100P    Little DIP (0-70)  70    (Blank rows = not tested)   HAND FUNCTION: Grip strength: Right:   lbs; Left:   lbs, Lateral pinch: Right:   lbs, Left:   lbs, and 3 point pinch: Right:   lbs, Left:   lbs Eval not tested  COORDINATION: 9 Hole Peg test: Right:   sec; Left:   sec Eval not tested  SENSATION: WFL  EDEMA: moderate edema   COGNITION: Overall cognitive status: Within functional limits for tasks assessed  OBSERVATIONS: bruising along dorsal aspect of hand, scar appears closed with sutures removed.    TREATMENT DATE: 10/20/24  Arrives wearing her custom hand-based yolk splint for R 3rd and 4th digits to prevent MCP flexion, to be worn during the day except when sitting resting hand in extension. Patient reports tolerating well.  Edema decreased compared to evaluation. Patient continues to have edema -recommend for patient to continue with soft resting cast that she arrived with at the evaluation to wear at nighttime because of the swelling. Was able to fit patient with a Isotoner glove to wear at nighttime as well as when just sitting resting. Measurements taken for active range of motion.  See flowsheet  Patient demo understanding for donning and doffing as well as wearing of  yolk splint. Continue to remove splint x3 daily  Can do contrast followed by passive range of motion composite flexion for 2nd and 5th 10 reps Blocked intrinsic a fist full all digits 10 reps Palm on table digital extension tapping each finger and all 4 10 reps All pain-free Wrist flexion extension 10 reps     PATIENT EDUCATION: Education details: findings of eval and HEP  Person educated: Patient Education method: Explanation, Demonstration, Tactile cues, Verbal cues, and Handouts Education comprehension: verbalized understanding, returned demonstration, verbal cues required, and needs further education   HOME EXERCISE PROGRAM: PROM 2nd and 5th digits composite flexion.  AROM with splint on DIP/PIP flexion all digits  GOALS: Goals reviewed with patient? Yes      SHORT TERM GOALS: Target date: 10/31/24   Pt will obtain protective, custom orthotic. Baseline: custom yolk splint made on eval  Goal status: MET  2.  Patient will independently don/doff and wear hand-based MCP extension splint on R third and fourth digits to avoid composite flexion and MCP flexion during activities.  Baseline: assist to don and educated on wearing  schedule  Goal status: INITIAL  3.  Pt will decrease pain at worst from 2/10 to 0/10 to have better sleep and occupational participation in daily roles. Baseline: 2/10  Goal status: INITIAL  LONG TERM GOALS: Target date: 12/12/24  Pt will demo/state understanding of initial HEP to improve pain levels and prerequisite motion. Baseline: initiated at eval with MAX cueing Goal status: INITIAL  2.  Pt will improve grip strength in R hand from unable to test to at least 40 lbs for functional use at home and in IADLs. Baseline: unable to test Goal status: INITIAL  3.  R hand 3-point pinch improved from unable to test to 5 lb pain free in pain-free for patient to initiate cutting meat. Baseline: unable to test Goal status: INITIAL  4.  Pt will decrease tenderness over scar and scar adhesions to have full range of motion at the digit flexion extension Baseline: Patient to be in [facility] block splint for 3 weeks. Goal status: INITIAL   ASSESSMENT: ASSESSMENT:  CLINICAL IMPRESSION: Patient seen today for occupational therapy following right middle and ring finger radial sagittal reconstructions on 09/23/2024.  Patient arrives with her hand-based custom yolk splint for R 3rd and 4th digits to prevent MCP flexion, to be worn daytime and with all activity.  At nighttime she can still continue with her soft brace because of continues edema.  Patient was fitted with Isotoner glove for nighttime.  Edema improved greatly.  Upgrade patient's home program for composite flexion for 2nd and 5th as well as blocked intrinsic a fist all 4 and digit extension.  All pain-free.  Continue with wrist flexion extension active range of motion.  Deficits noted in ROM, strength, and edema. Pt will  benefit from OT services for splint fabrication, RUE ROM exercises, and pain management strategies in order to improve engagement during basic ADL/IADL tasks.     PERFORMANCE DEFICITS: in functional skills including ADLs, IADLs,  ROM, strength, pain, flexibility, decreased knowledge of use of DME, and UE functional use,   and psychosocial skills including environmental adaptation and routines and behaviors.   IMPAIRMENTS: are limiting patient from ADLs, IADLs, rest and sleep, play, leisure, and social participation.   COMORBIDITIES: has no other co-morbidities that affects occupational performance. Patient will benefit from skilled OT to address above impairments and improve overall function.  MODIFICATION OR ASSISTANCE TO COMPLETE EVALUATION: No modification of tasks or assist necessary to complete an evaluation.  OT OCCUPATIONAL PROFILE AND HISTORY: Problem focused assessment: Including review of records relating to presenting problem.  CLINICAL DECISION MAKING: LOW - limited treatment options, no task modification necessary  REHAB POTENTIAL: Good for goals  EVALUATION COMPLEXITY: Low        PLAN:  OT FREQUENCY: 1-2x/week  OT DURATION: 8 weeks  PLANNED INTERVENTIONS: 97168 OT Re-evaluation, 97535 self care/ADL training, 02889 therapeutic exercise, 97530 therapeutic activity, 97140 manual therapy, 97018 paraffin, 02960 fluidotherapy, 97034 contrast bath, 97760 Orthotic Initial, [id number] Orthotic/Prosthetic subsequent, scar mobilization, passive range of motion, and patient/family education  RECOMMENDED OTHER SERVICES: none  CONSULTED AND AGREED WITH PLAN OF CARE: Patient  PLAN FOR NEXT SESSION: splint check     Ancel Peters, OTR/L,CLT 10/20/2024, 8:52 AM

## 2024-10-23 ENCOUNTER — Ambulatory Visit: Admitting: Occupational Therapy

## 2024-10-23 DIAGNOSIS — M79641 Pain in right hand: Secondary | ICD-10-CM

## 2024-10-23 DIAGNOSIS — M66241 Spontaneous rupture of extensor tendons, right hand: Secondary | ICD-10-CM | POA: Diagnosis not present

## 2024-10-23 DIAGNOSIS — M66242 Spontaneous rupture of extensor tendons, left hand: Secondary | ICD-10-CM

## 2024-10-23 NOTE — Therapy (Signed)
 OUTPATIENT OCCUPATIONAL THERAPY ORTHO TREATMENT  Patient Name: Brittany Franklin MRN: [id number] DOB:May 28, 1951, 73 y.o., female Today's Date: 10/23/2024  PCP: Edsel Pepper, PA REFERRING PROVIDER: Jackquline Barrack, MD  END OF SESSION:  OT End of Session - 10/23/24 1243     Visit Number 3    Number of Visits 16    Date for Recertification  12/12/24    OT Start Time 1035    OT Stop Time 1120    OT Time Calculation (min) 45 min    Activity Tolerance Patient tolerated treatment well    Behavior During Therapy WFL for tasks assessed/performed          Past Medical History:  Diagnosis Date   Arthritis    Cancer (HCC)    COLON CANCER   Colitis    Colon cancer (HCC)    GERD (gastroesophageal reflux disease)    GERD without esophagitis    History of colon polyps    History of hiatal hernia    small   Hyperlipidemia    Hypertension    Neuromuscular disorder (HCC)    idiopathic neuropathy   Numbness and tingling in both hands    Osteopenia    Osteopenia of multiple sites    PONV (postoperative nausea and vomiting)    very bad nausea after colon surgery-- FENTANYL  MADE HER SICK   Rosacea    Sensory polyneuropathy    Past Surgical History:  Procedure Laterality Date   ABDOMINAL HYSTERECTOMY     BREAST BIOPSY Right 07/19/2022   Stereo right bx, Ribbon Clip - CYSTIC PAPILLARY APOCRINE METAPLASIA.   CHOLECYSTECTOMY     COLON SURGERY  2012   COLONOSCOPY WITH PROPOFOL  N/A 10/30/2017   Procedure: COLONOSCOPY WITH PROPOFOL ;  Surgeon: Gaylyn Gladis PENNER, MD;  Location: [facility] ENDOSCOPY;  Service: Endoscopy;  Laterality: N/A;   COLONOSCOPY WITH PROPOFOL  N/A 10/15/2020   Procedure: COLONOSCOPY WITH PROPOFOL ;  Surgeon: Maryruth Ole DASEN, MD;  Location: [facility] ENDOSCOPY;  Service: Endoscopy;  Laterality: N/A;   ESOPHAGOGASTRODUODENOSCOPY (EGD) WITH PROPOFOL  N/A 10/30/2017   Procedure: ESOPHAGOGASTRODUODENOSCOPY (EGD) WITH PROPOFOL ;  Surgeon: Gaylyn Gladis PENNER, MD;  Location:  [facility] ENDOSCOPY;  Service: Endoscopy;  Laterality: N/A;   ESOPHAGOGASTRODUODENOSCOPY (EGD) WITH PROPOFOL  N/A 10/19/2021   Procedure: ESOPHAGOGASTRODUODENOSCOPY (EGD) WITH PROPOFOL ;  Surgeon: Toledo, Ladell POUR, MD;  Location: [facility] ENDOSCOPY;  Service: Gastroenterology;  Laterality: N/A;   EYE SURGERY Bilateral    January 17and January 04, 2024   REPAIR EXTENSOR TENDON Right 09/23/2024   Procedure: REPAIR, TENDON, EXTENSOR;  Surgeon: Barrack Jackquline RAMAN, MD;  Location: [facility];  Service: Orthopedics;  Laterality: Right;  Right middle finger sagittal band reconstruction; Right ring finger sagittal band reconstruction   TONSILLECTOMY     as achild   There are no active problems to display for this patient.   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 female patient who is s/p right middle and ring finger radial sagittal reconstructions on 09/23/2024. Previously referred to OT on 08/11/24 for orthosis following nontraumatic sagittal band rupture.  10/15/24  ortho visit:  - Sutures removed in clinic today - 3 weeks total of immobilization with fingers in extension - Referral to occupational therapy for custom orthosis and to begin therapy protocol - Follow-up in 4 weeks  PRECAUTIONS: None  RED FLAGS: None   WEIGHT BEARING RESTRICTIONS: No  PAIN:  Are you having pain?no pain  FALLS: Has patient fallen in last 6 months? No  LIVING ENVIRONMENT: Lives with: lives with their spouse  PLOF:   Patient is a retired tourist information centre manager.  She is very active working out-doing spin class, total body workout, working out 2 times a week with a trainer (currently focused on lower body only).   PATIENT GOALS: to return to exercise   NEXT MD VISIT: 11/07/24  OBJECTIVE:  Note: Objective measures were completed at Evaluation unless otherwise noted.  HAND DOMINANCE: Right  ADLs: Overall ADLs: buttoning clothes, cutting food  FUNCTIONAL OUTCOME MEASURES: PRHE:   UPPER EXTREMITY ROM:     Active ROM Right 10/20/24 Left eval  Shoulder flexion    Shoulder abduction    Shoulder adduction    Shoulder extension    Shoulder internal rotation    Shoulder external rotation    Elbow flexion    Elbow extension    Wrist flexion 55   Wrist extension 65   Wrist ulnar deviation    Wrist radial deviation    Wrist pronation    Wrist supination    (Blank rows = not tested)  Active ROM Right 10/20/24 Left eval R 10/23/24  Thumb MCP (0-60)     Thumb IP (0-80)     Thumb Radial abd/add (0-55)      Thumb Palmar abd/add (0-45)      Thumb Opposition to Small Finger      Index MCP (0-90) 80P     Index PIP (0-100) 90     Index DIP (0-70) 70      Long MCP (0-90)       Long PIP (0-100) 80    95  Long DIP (0-70)  70     Ring MCP (0-90)       Ring PIP (0-100) 85    95  Ring DIP (0-70)  70     Little MCP (0-90) 90 P     Little PIP (0-100) 100P     Little DIP (0-70)  70     (Blank rows = not tested)   HAND FUNCTION: Grip strength: Right:   lbs; Left:   lbs, Lateral pinch: Right:   lbs, Left:   lbs, and 3 point pinch: Right:   lbs, Left:   lbs Eval not tested  COORDINATION: 9 Hole Peg test: Right:   sec; Left:   sec Eval not tested  SENSATION: WFL  EDEMA: moderate edema   COGNITION: Overall cognitive status: Within functional limits for tasks assessed  OBSERVATIONS: bruising along dorsal aspect of hand, scar appears closed with sutures removed.    TREATMENT DATE:  10/23/24  Arrives wearing her custom hand-based yolk splint for R 3rd and 4th digits to prevent MCP flexion, to be worn during the day except when sitting resting hand in extension. Patient reports tolerating well.  Edema decreased   Patient continues to have edema over the dorsal hand.  Continue to do contrast Was able to fit patient with a Isotoner glove to wear during the day when just sitting resting. Increase active range of motion at 2nd and 5th digit as well as PIPs of 3rd and 4th pain-free  Patient demo understanding for donning and doffing as well as wearing of  yolk splint. Patient can start wearing it at nighttime.  Some modifications done with some moleskin to built-up volar part to block 3rd and 4th MC flexion Continue to remove splint x3 daily  Can do contrast followed by passive range of motion composite flexion for 2nd and 5th 10 reps Blocked intrinsic a fist full all digits 10 reps Palm on table digital extension tapping each finger and all 4 10 reps All pain-free Wrist flexion extension 10 reps  Scar mobilization was done today by therapist as well as patient was educated in scar mobilization. Patient was some scar adhesions proximally. Patient was educated in use of Kinesiotape for scar mobilization during the day and a star pattern 3. OT done for patient to wear during the day to facilitate some scar mobilization during the day with range of motion of the right hand. Continue with Cica -Care scar pad for nighttime. Patient can leave off Kinesiotape every 24-48 hours if any redness occur.  PATIENT EDUCATION: Education details: findings of eval and HEP  Person educated: Patient Education method: Explanation, Demonstration, Tactile cues, Verbal cues, and Handouts Education comprehension: verbalized understanding, returned demonstration, verbal cues  required, and needs further education   HOME EXERCISE PROGRAM: PROM 2nd and 5th digits composite flexion.  AROM with splint on DIP/PIP flexion all digits  GOALS: Goals reviewed with patient? Yes      SHORT TERM GOALS: Target date: 10/31/24   Pt will obtain protective, custom orthotic. Baseline: custom yolk splint made on eval  Goal status: MET  2.  Patient will independently don/doff and wear hand-based MCP extension splint on R third and fourth digits to avoid composite flexion and MCP flexion during activities.  Baseline: assist to don and educated on wearing schedule  Goal status: INITIAL  3.  Pt will decrease pain at worst from 2/10 to 0/10 to have better sleep and occupational participation in daily roles. Baseline: 2/10  Goal status: INITIAL  LONG TERM GOALS: Target date: 12/12/24  Pt will demo/state understanding of initial HEP to improve pain levels and prerequisite motion. Baseline: initiated at eval with MAX cueing Goal status: INITIAL  2.  Pt will improve grip strength in R hand from unable to test to at least 40 lbs for functional use at home and in IADLs. Baseline: unable to test Goal status: INITIAL  3.  R hand 3-point pinch improved from unable to test to 5 lb pain free in pain-free for patient to initiate cutting meat. Baseline: unable to test Goal status: INITIAL  4.  Pt will decrease tenderness over scar and scar adhesions to have full range of motion at the digit flexion extension Baseline: Patient to be in [facility] block splint for 3 weeks. Goal status: INITIAL   ASSESSMENT: ASSESSMENT:  CLINICAL IMPRESSION: Patient seen today for occupational therapy following right middle and ring finger radial sagittal reconstructions on 09/23/2024.  Patient arrives  with her hand-based custom yolk splint for R 3rd and 4th digits to prevent MCP flexion, to be worn daytime and with all activity-she can start wearing it also at nighttime.  Did build up volar part of 3rd  and 4th to block MC flexion at night more.  Patient can continue with Isotoner glove to use some during the day when just sitting..  Edema improved greatly.  Continue with home program for composite flexion for 2nd and 5th as well as blocked intrinsic a fist all 4 and digit extension.  All pain-free.  Continue with wrist flexion extension active range of motion.  Patient was educated on scar massage and mobilization as well as Cica -Care scar pad at nighttime.  Improving greatly no pain.  Deficits noted in ROM, strength, and edema. Pt will benefit from OT services for splint fabrication, RUE ROM exercises, and pain management strategies in order to improve engagement during basic ADL/IADL tasks.     PERFORMANCE DEFICITS: in functional skills including ADLs, IADLs, ROM, strength, pain, flexibility, decreased knowledge of use of DME, and UE functional use,   and psychosocial skills including environmental adaptation and routines and behaviors.   IMPAIRMENTS: are limiting patient from ADLs, IADLs, rest and sleep, play, leisure, and social participation.   COMORBIDITIES: has no other co-morbidities that affects occupational performance. Patient will benefit from skilled OT to address above impairments and improve overall function.  MODIFICATION OR ASSISTANCE TO COMPLETE EVALUATION: No modification of tasks or assist necessary to complete an evaluation.  OT OCCUPATIONAL PROFILE AND HISTORY: Problem focused assessment: Including review of records relating to presenting problem.  CLINICAL DECISION MAKING: LOW - limited treatment options, no task modification necessary  REHAB POTENTIAL: Good for goals  EVALUATION COMPLEXITY: Low        PLAN:  OT FREQUENCY: 1-2x/week  OT DURATION: 8 weeks  PLANNED INTERVENTIONS: 97168 OT Re-evaluation, 97535 self care/ADL training, 02889 therapeutic exercise, 97530 therapeutic activity, 97140 manual therapy, 97018 paraffin, 02960 fluidotherapy, 97034 contrast  bath, 97760 Orthotic Initial, [id number] Orthotic/Prosthetic subsequent, scar mobilization, passive range of motion, and patient/family education  RECOMMENDED OTHER SERVICES: none  CONSULTED AND AGREED WITH PLAN OF CARE: Patient  PLAN FOR NEXT SESSION: splint check     Ancel Peters, OTR/L,CLT 10/23/2024, 12:46 PM

## 2024-10-28 ENCOUNTER — Ambulatory Visit: Admitting: Occupational Therapy

## 2024-10-28 DIAGNOSIS — M66241 Spontaneous rupture of extensor tendons, right hand: Secondary | ICD-10-CM

## 2024-10-28 DIAGNOSIS — M79641 Pain in right hand: Secondary | ICD-10-CM

## 2024-10-28 NOTE — Therapy (Signed)
 OUTPATIENT OCCUPATIONAL THERAPY ORTHO TREATMENT  Patient Name: Brittany Franklin MRN: [id number] DOB:07-23-1951, 73 y.o., female Today's Date: 10/28/2024  PCP: Edsel Pepper, PA REFERRING PROVIDER: Jackquline Barrack, MD  END OF SESSION:  OT End of Session - 10/28/24 0904     Visit Number 4    Number of Visits 16    Date for Recertification  12/12/24    OT Start Time 0903    OT Stop Time 0936    OT Time Calculation (min) 33 min    Activity Tolerance Patient tolerated treatment well    Behavior During Therapy [facility] for tasks assessed/performed          Past Medical History:  Diagnosis Date   Arthritis    Cancer (HCC)    COLON CANCER   Colitis    Colon cancer (HCC)    GERD (gastroesophageal reflux disease)    GERD without esophagitis    History of colon polyps    History of hiatal hernia    small   Hyperlipidemia    Hypertension    Neuromuscular disorder (HCC)    idiopathic neuropathy   Numbness and tingling in both hands    Osteopenia    Osteopenia of multiple sites    PONV (postoperative nausea and vomiting)    very bad nausea after colon surgery-- FENTANYL  MADE HER SICK   Rosacea    Sensory polyneuropathy    Past Surgical History:  Procedure Laterality Date   ABDOMINAL HYSTERECTOMY     BREAST BIOPSY Right 07/19/2022   Stereo right bx, Ribbon Clip - CYSTIC PAPILLARY APOCRINE METAPLASIA.   CHOLECYSTECTOMY     COLON SURGERY  2012   COLONOSCOPY WITH PROPOFOL  N/A 10/30/2017   Procedure: COLONOSCOPY WITH PROPOFOL ;  Surgeon: Gaylyn Gladis PENNER, MD;  Location: [facility] ENDOSCOPY;  Service: Endoscopy;  Laterality: N/A;   COLONOSCOPY WITH PROPOFOL  N/A 10/15/2020   Procedure: COLONOSCOPY WITH PROPOFOL ;  Surgeon: Maryruth Ole DASEN, MD;  Location: [facility] ENDOSCOPY;  Service: Endoscopy;  Laterality: N/A;   ESOPHAGOGASTRODUODENOSCOPY (EGD) WITH PROPOFOL  N/A 10/30/2017   Procedure: ESOPHAGOGASTRODUODENOSCOPY (EGD) WITH PROPOFOL ;  Surgeon: Gaylyn Gladis PENNER, MD;  Location:  [facility] ENDOSCOPY;  Service: Endoscopy;  Laterality: N/A;   ESOPHAGOGASTRODUODENOSCOPY (EGD) WITH PROPOFOL  N/A 10/19/2021   Procedure: ESOPHAGOGASTRODUODENOSCOPY (EGD) WITH PROPOFOL ;  Surgeon: Toledo, Ladell POUR, MD;  Location: [facility] ENDOSCOPY;  Service: Gastroenterology;  Laterality: N/A;   EYE SURGERY Bilateral    January 17and January 04, 2024   REPAIR EXTENSOR TENDON Right 09/23/2024   Procedure: REPAIR, TENDON, EXTENSOR;  Surgeon: Barrack Jackquline RAMAN, MD;  Location: [facility]ERY [facility];  Service: Orthopedics;  Laterality: Right;  Right middle finger sagittal band reconstruction; Right ring finger sagittal band reconstruction   TONSILLECTOMY     as achild   There are no active problems to display for this patient.   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 female patient who is s/p right middle and ring finger radial sagittal reconstructions on 09/23/2024. Previously referred to OT on 08/11/24 for orthosis following nontraumatic sagittal band rupture.  10/15/24 ortho visit:  -  Sutures removed in clinic today - 3 weeks total of immobilization with fingers in extension - Referral to occupational therapy for custom orthosis and to begin therapy protocol - Follow-up in 4 weeks  PRECAUTIONS: None  RED FLAGS: None   WEIGHT BEARING RESTRICTIONS: No  PAIN:  Are you having pain?no pain  FALLS: Has patient fallen in last 6 months? No  LIVING ENVIRONMENT: Lives with: lives with their spouse  PLOF:  Patient is a retired research officer, trade [employer].  She is very active working out-doing spin class, total body workout, working out 2 times a week with a trainer (currently focused on lower body only).   PATIENT GOALS: to return to exercise   NEXT MD VISIT: 11/07/24  OBJECTIVE:  Note: Objective measures were completed at Evaluation unless otherwise noted.  HAND DOMINANCE: Right  ADLs: Overall ADLs: buttoning clothes, cutting food  FUNCTIONAL OUTCOME MEASURES: PRHE:   UPPER EXTREMITY ROM:     Active ROM Right 10/20/24 Left eval R 10/28/24  Shoulder flexion     Shoulder abduction     Shoulder adduction     Shoulder extension     Shoulder internal rotation     Shoulder external rotation     Elbow flexion     Elbow extension     Wrist flexion 55  70  Wrist extension 65  65  Wrist ulnar deviation     Wrist radial deviation     Wrist pronation     Wrist supination     (Blank rows = not tested)  Active ROM Right 10/20/24 Left eval R 10/23/24  Thumb MCP (0-60)     Thumb IP (0-80)     Thumb Radial abd/add (0-55)      Thumb Palmar abd/add (0-45)      Thumb Opposition to Small Finger      Index MCP (0-90) 80P     Index PIP (0-100) 90     Index DIP (0-70) 70      Long MCP (0-90)       Long PIP (0-100) 80    95  Long DIP (0-70)  70     Ring MCP (0-90)       Ring PIP (0-100) 85    95  Ring DIP (0-70)  70     Little MCP (0-90) 90 P     Little PIP (0-100) 100P     Little DIP (0-70)  70     (Blank rows = not tested)   HAND FUNCTION: Grip strength: Right:   lbs; Left:   lbs, Lateral pinch: Right:   lbs, Left:   lbs, and 3 point pinch: Right:   lbs, Left:   lbs Eval not tested  COORDINATION: 9 Hole Peg test: Right:   sec; Left:   sec Eval not tested  SENSATION: WFL  EDEMA: moderate edema   COGNITION: Overall cognitive status: Within functional limits for tasks assessed  OBSERVATIONS: bruising along dorsal aspect of hand, scar appears closed with sutures removed.    TREATMENT DATE: 10/28/24  Arrives wearing her custom hand-based yolk splint for R 3rd and 4th digits to prevent MCP flexion, to be worn during the day except when sitting resting hand in extension. Patient reports tolerating well.  Edema decreased   Patient to cont with moist heat if no edema or contrast if edema cont Isotoner glove to wear during the day when just sitting resting. Increase active range of motion at 2nd and 5th digit as well as PIPs of 3rd and 4th pain-free As well as wrist flexion and ext  Patient demo understanding for donning and doffing as well as wearing of  yolk splint. Patient is wearing it at  nighttime.  Some modifications done to straighten volar block at 3rd and 4th to decrease ADD of 2nd and 5th  More comfort with wearing per pt afterwards  Continue to remove splint x3 daily    Cont with passive range of motion composite flexion for 2nd and 5th 10 reps Blocked intrinsic fist all digits 10 reps Palm on table digital extension tapping each finger and all 4 10 reps All pain-free Wrist flexion extension 10 reps  Scar mobilization was done today by therapist as well as patient was educated in scar mobilization. Patient was some scar adhesions proximally. Patient was educated in use of Kinesiotape for scar mobilization during the day and a star pattern 3. OT done for patient to wear during the day to facilitate some scar mobilization during the day with range of motion of the right hand. Provided new ones and reed again Continue with Cica -Care scar pad for nighttime. Patient can leave off Kinesiotape every 24-48 hours if any redness occur.  PATIENT EDUCATION: Education details: findings of eval and HEP  Person educated: Patient Education method: Explanation, Demonstration, Tactile cues, Verbal cues, and Handouts Education comprehension: verbalized understanding,  returned demonstration, verbal cues required, and needs further education   HOME EXERCISE PROGRAM: PROM 2nd and 5th digits composite flexion.  AROM with splint on DIP/PIP flexion all digits  GOALS: Goals reviewed with patient? Yes      SHORT TERM GOALS: Target date: 10/31/24   Pt will obtain protective, custom orthotic. Baseline: custom yolk splint made on eval  Goal status: MET  2.  Patient will independently don/doff and wear hand-based MCP extension splint on R third and fourth digits to avoid composite flexion and MCP flexion during activities.  Baseline: assist to don and educated on wearing schedule  Goal status: INITIAL  3.  Pt will decrease pain at worst from 2/10 to 0/10 to have better sleep and occupational participation in daily roles. Baseline: 2/10  Goal status: INITIAL  LONG TERM GOALS: Target date: 12/12/24  Pt will demo/state understanding of initial HEP to improve pain levels and prerequisite motion. Baseline: initiated at eval with MAX cueing Goal status: INITIAL  2.  Pt will improve grip strength in R hand from unable to test to at least 40 lbs for functional use at home and in IADLs. Baseline: unable to test Goal status: INITIAL  3.  R hand 3-point pinch improved from unable to test to 5 lb pain free in pain-free for patient to initiate cutting meat. Baseline: unable to test Goal status: INITIAL  4.  Pt will decrease tenderness over scar and scar adhesions to have full range of motion at the digit flexion extension Baseline: Patient to be in [facility] block splint for 3 weeks. Goal status: INITIAL   ASSESSMENT: ASSESSMENT:  CLINICAL IMPRESSION: Patient seen today for occupational therapy following  right middle and ring finger radial sagittal reconstructions on 09/23/2024.  Patient arrives with her hand-based custom yolk splint for R 3rd and 4th digits to prevent MCP flexion, to be worn daytime and with all activity-she can start wearing it also at  nighttime. Did straighten the volar block at  3rd and 4th little more -to decrease ADD of 2nd and 5th during flexion  Patient can continue with Isotoner glove to use some during the day when just sitting..  Edema improved greatly.  Continue with home program for composite flexion for 2nd and 5th as well as blocked intrinsic a fist all 4 and digit extension.  All pain-free.  Continue with wrist flexion extension active range of motion.  Patient was educated on scar massage and mobilization as well as Cica -Care scar pad at nighttime.  Improving greatly no pain.  Deficits noted in ROM, strength, and edema. Pt will benefit from OT services for splint fabrication, RUE ROM exercises, and pain management strategies in order to improve engagement during basic ADL/IADL tasks.     PERFORMANCE DEFICITS: in functional skills including ADLs, IADLs, ROM, strength, pain, flexibility, decreased knowledge of use of DME, and UE functional use,   and psychosocial skills including environmental adaptation and routines and behaviors.   IMPAIRMENTS: are limiting patient from ADLs, IADLs, rest and sleep, play, leisure, and social participation.   COMORBIDITIES: has no other co-morbidities that affects occupational performance. Patient will benefit from skilled OT to address above impairments and improve overall function.  MODIFICATION OR ASSISTANCE TO COMPLETE EVALUATION: No modification of tasks or assist necessary to complete an evaluation.  OT OCCUPATIONAL PROFILE AND HISTORY: Problem focused assessment: Including review of records relating to presenting problem.  CLINICAL DECISION MAKING: LOW - limited treatment options, no task modification necessary  REHAB POTENTIAL: Good for goals  EVALUATION COMPLEXITY: Low        PLAN:  OT FREQUENCY: 1-2x/week  OT DURATION: 8 weeks  PLANNED INTERVENTIONS: 97168 OT Re-evaluation, 97535 self care/ADL training, 02889 therapeutic exercise, 97530 therapeutic activity,  97140 manual therapy, 97018 paraffin, 02960 fluidotherapy, 97034 contrast bath, 97760 Orthotic Initial, [id number] Orthotic/Prosthetic subsequent, scar mobilization, passive range of motion, and patient/family education  RECOMMENDED OTHER SERVICES: none  CONSULTED AND AGREED WITH PLAN OF CARE: Patient  PLAN FOR NEXT SESSION: splint check     Ancel Peters, OTR/L,CLT 10/28/2024, 9:46 AM

## 2024-11-04 ENCOUNTER — Ambulatory Visit: Admitting: Occupational Therapy

## 2024-11-06 ENCOUNTER — Ambulatory Visit: Attending: Sports Medicine | Admitting: Occupational Therapy

## 2024-11-06 DIAGNOSIS — M79642 Pain in left hand: Secondary | ICD-10-CM | POA: Diagnosis present

## 2024-11-06 DIAGNOSIS — M66242 Spontaneous rupture of extensor tendons, left hand: Secondary | ICD-10-CM | POA: Diagnosis present

## 2024-11-06 DIAGNOSIS — M79641 Pain in right hand: Secondary | ICD-10-CM | POA: Insufficient documentation

## 2024-11-06 DIAGNOSIS — M66241 Spontaneous rupture of extensor tendons, right hand: Secondary | ICD-10-CM | POA: Insufficient documentation

## 2024-11-06 NOTE — Therapy (Signed)
 OUTPATIENT OCCUPATIONAL THERAPY ORTHO TREATMENT  Patient Name: Brittany Franklin MRN: [id number] DOB:19-Aug-1951, 73 y.o., female Today's Date: 11/06/2024  PCP: Edsel Pepper, PA REFERRING PROVIDER: Jackquline Barrack, MD  END OF SESSION:  OT End of Session - 11/06/24 0945     Visit Number 5    Number of Visits 16    Date for Recertification  12/12/24    OT Start Time 0945    Activity Tolerance Patient tolerated treatment well    Behavior During Therapy [facility] for tasks assessed/performed          Past Medical History:  Diagnosis Date   Arthritis    Cancer (HCC)    COLON CANCER   Colitis    Colon cancer (HCC)    GERD (gastroesophageal reflux disease)    GERD without esophagitis    History of colon polyps    History of hiatal hernia    small   Hyperlipidemia    Hypertension    Neuromuscular disorder (HCC)    idiopathic neuropathy   Numbness and tingling in both hands    Osteopenia    Osteopenia of multiple sites    PONV (postoperative nausea and vomiting)    very bad nausea after colon surgery-- FENTANYL  MADE HER SICK   Rosacea    Sensory polyneuropathy    Past Surgical History:  Procedure Laterality Date   ABDOMINAL HYSTERECTOMY     BREAST BIOPSY Right 07/19/2022   Stereo right bx, Ribbon Clip - CYSTIC PAPILLARY APOCRINE METAPLASIA.   CHOLECYSTECTOMY     COLON SURGERY  2012   COLONOSCOPY WITH PROPOFOL  N/A 10/30/2017   Procedure: COLONOSCOPY WITH PROPOFOL ;  Surgeon: Gaylyn Gladis PENNER, MD;  Location: [facility] ENDOSCOPY;  Service: Endoscopy;  Laterality: N/A;   COLONOSCOPY WITH PROPOFOL  N/A 10/15/2020   Procedure: COLONOSCOPY WITH PROPOFOL ;  Surgeon: Maryruth Ole DASEN, MD;  Location: [facility] ENDOSCOPY;  Service: Endoscopy;  Laterality: N/A;   ESOPHAGOGASTRODUODENOSCOPY (EGD) WITH PROPOFOL  N/A 10/30/2017   Procedure: ESOPHAGOGASTRODUODENOSCOPY (EGD) WITH PROPOFOL ;  Surgeon: Gaylyn Gladis PENNER, MD;  Location: [facility] ENDOSCOPY;  Service: Endoscopy;  Laterality: N/A;    ESOPHAGOGASTRODUODENOSCOPY (EGD) WITH PROPOFOL  N/A 10/19/2021   Procedure: ESOPHAGOGASTRODUODENOSCOPY (EGD) WITH PROPOFOL ;  Surgeon: Toledo, Ladell POUR, MD;  Location: [facility] ENDOSCOPY;  Service: Gastroenterology;  Laterality: N/A;   EYE SURGERY Bilateral    January 17and January 04, 2024   REPAIR EXTENSOR TENDON Right 09/23/2024   Procedure: REPAIR, TENDON, EXTENSOR;  Surgeon: Barrack Jackquline RAMAN, MD;  Location: [facility]ERY [facility];  Service: Orthopedics;  Laterality: Right;  Right middle finger sagittal band reconstruction; Right ring finger sagittal band reconstruction   TONSILLECTOMY     as achild   There are no active problems to display for this patient.   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 female patient who is s/p right middle and ring finger radial sagittal reconstructions on 09/23/2024. Previously referred to OT on 08/11/24 for orthosis following nontraumatic sagittal band rupture.  10/15/24 ortho visit:  - Sutures removed in clinic today - 3 weeks total of immobilization  with fingers in extension - Referral to occupational therapy for custom orthosis and to begin therapy protocol - Follow-up in 4 weeks  PRECAUTIONS: None  RED FLAGS: None   WEIGHT BEARING RESTRICTIONS: No  PAIN:  Are you having pain?no pain  FALLS: Has patient fallen in last 6 months? No  LIVING ENVIRONMENT: Lives with: lives with their spouse  PLOF:  Patient is a retired [employer] information [employer] manager.  She is  very active working out-doing spin class, total body workout, working out 2 times a week with a trainer (currently focused on lower body only).   PATIENT GOALS: to return to exercise   NEXT MD VISIT: 11/07/24  OBJECTIVE:  Note: Objective measures were completed at Evaluation unless otherwise noted.  HAND DOMINANCE: Right  ADLs: Overall ADLs: buttoning clothes, cutting food  FUNCTIONAL OUTCOME MEASURES: PRHE:   UPPER EXTREMITY ROM:     Active ROM Right 10/20/24 Left eval R 10/28/24  Shoulder flexion     Shoulder abduction     Shoulder adduction     Shoulder extension     Shoulder internal rotation     Shoulder external rotation     Elbow flexion     Elbow extension     Wrist flexion 55  70  Wrist extension 65  65  Wrist ulnar deviation     Wrist radial deviation     Wrist pronation     Wrist supination     (Blank rows = not tested)  Active ROM Right 10/20/24 Left eval R 10/23/24  Thumb MCP (0-60)     Thumb IP (0-80)     Thumb Radial abd/add (0-55)      Thumb Palmar abd/add (0-45)      Thumb Opposition to Small Finger      Index MCP (0-90) 80P     Index PIP (0-100) 90     Index DIP (0-70) 70      Long MCP (0-90)       Long PIP (0-100) 80    95  Long DIP (0-70)  70     Ring MCP (0-90)       Ring PIP (0-100) 85    95  Ring DIP (0-70)  70     Little MCP (0-90) 90 P     Little PIP (0-100) 100P     Little DIP (0-70)  70     (Blank rows = not tested)   HAND FUNCTION: Grip strength: Right:   lbs; Left:   lbs, Lateral pinch: Right:   lbs, Left:   lbs, and 3 point pinch: Right:   lbs, Left:   lbs Eval not tested  COORDINATION: 9 Hole Peg test: Right:   sec; Left:   sec Eval not tested  SENSATION: WFL  EDEMA: moderate edema   COGNITION: Overall cognitive status: Within functional limits for tasks assessed  OBSERVATIONS: bruising along dorsal aspect of hand, scar appears closed with sutures removed.    TREATMENT DATE: 11/06/24  Arrives wearing her custom hand-based yolk splint for R 3rd and 4th digits to prevent MCP flexion, to be worn during the day except when sitting resting hand in extension. Needed some padding over dorsal proximal phalanges of 2nd digit Patient reports tolerating well.  Edema decreased    cont Isotoner glove to wear during the day when just sitting resting.  Patient demo understanding for donning and doffing as well as wearing of  yolk splint. Patient is wearing it at  nighttime.  Some modifications done to straighten volar block at 3rd and 4th to decrease ADD of 2nd and 5th  More comfort with wearing per pt afterwards  Continue to remove splint x3 daily    Cont with passive range of motion composite flexion for 2nd and 5th 10 reps Blocked intrinsic fist all digits 10 reps Palm on table digital extension tapping each finger and all 4 10 reps All pain-free Wrist flexion extension 10 reps  Pt follow up with DR Ezra tomorrow  Can check and see if she can start tendon glides pain free -5 x day -after some heat  Keeping pain under 2./10  Paraffin done 8 min prior to scar massage to increase scar mobs , decrease scar adhesions   Scar mobilization was done today by therapist as well as patient was educated in scar mobilization. Patient was some scar adhesions proximally. Patient was educated in use of Kinesiotape for scar mobilization during the day and a star pattern 3. OT done for patient to wear during the day to facilitate some scar mobilization during the day with range of motion of the right hand. Provided new ones and reed again Continue with Cica -Care scar pad for nighttime. Patient can leave off Kinesiotape every 24-48 hours if any redness occur.  PATIENT EDUCATION: Education details: findings of eval and HEP  Person educated: Patient Education method: Explanation,  Demonstration, Tactile cues, Verbal cues, and Handouts Education comprehension: verbalized understanding, returned demonstration, verbal cues required, and needs further education      GOALS: Goals reviewed with patient? Yes      SHORT TERM GOALS: Target date: 10/31/24   Pt will obtain protective, custom orthotic. Baseline: custom yolk splint made on eval  Goal status: MET  2.  Patient will independently don/doff and wear hand-based MCP extension splint on R third and fourth digits to avoid composite flexion and MCP flexion during activities.  Baseline: assist to don and educated on wearing schedule  Goal status: INITIAL  3.  Pt will decrease pain at worst from 2/10 to 0/10 to have better sleep and occupational participation in daily roles. Baseline: 2/10  Goal status: INITIAL  LONG TERM GOALS: Target date: 12/12/24  Pt will demo/state understanding of initial HEP to improve pain levels and prerequisite motion. Baseline: initiated at eval with MAX cueing Goal status: INITIAL  2.  Pt will improve grip strength in R hand from unable to test to at least 40 lbs for functional use at home and in IADLs. Baseline: unable to test Goal status: INITIAL  3.  R hand 3-point pinch improved from unable to test to 5 lb pain free in pain-free for patient to initiate cutting meat. Baseline: unable to test Goal status: INITIAL  4.  Pt will decrease tenderness over scar and scar adhesions to have full range of motion at the digit flexion extension Baseline: Patient to be in [facility] block splint for 3 weeks. Goal status: INITIAL   ASSESSMENT: ASSESSMENT:  CLINICAL IMPRESSION: Patient seen  for occupational therapy following right middle and ring finger radial sagittal reconstructions on 09/23/2024.  Pt is about 6 wks postop -  Patient arrives with her hand-based custom yolk splint for R 3rd and 4th digits to prevent MCP flexion, to be worn daytime  and night time  Did straighten the volar block  at  3rd and 4th little more -to decrease ADD of 2nd and 5th during flexion  Patient can continue with Isotoner glove to use some during the day when just sitting..  Edema improved greatly.  Continue with home program for composite flexion for 2nd and 5th as well as blocked intrinsic a fist all 4 and digit extension.  Wrist AROM - pt can check with surgeon if can start tendon glides pain free - 5 x day - cont to keep splint on inbetween and night time -   Patient was educated on scar massage and mobilization as well as Cica -Care scar pad at nighttime.  Improving greatly no pain.  Deficits noted in ROM, strength, and edema. Pt will benefit from OT services for splint fabrication, RUE ROM exercises, and pain management strategies in order to improve engagement during basic ADL/IADL tasks.     PERFORMANCE DEFICITS: in functional skills including ADLs, IADLs, ROM, strength, pain, flexibility, decreased knowledge of use of DME, and UE functional use,   and psychosocial skills including environmental adaptation and routines and behaviors.   IMPAIRMENTS: are limiting patient from ADLs, IADLs, rest and sleep, play, leisure, and social participation.   COMORBIDITIES: has no other co-morbidities that affects occupational performance. Patient will benefit from skilled OT to address above impairments and improve overall function.  MODIFICATION OR ASSISTANCE TO COMPLETE EVALUATION: No modification of tasks or assist necessary to complete an evaluation.  OT OCCUPATIONAL PROFILE AND HISTORY: Problem focused assessment: Including review of records relating to presenting problem.  CLINICAL DECISION MAKING: LOW - limited treatment options, no task modification necessary  REHAB POTENTIAL: Good for goals  EVALUATION COMPLEXITY: Low        PLAN:  OT FREQUENCY: 1-2x/week  OT DURATION: 8 weeks  PLANNED INTERVENTIONS: 97168 OT Re-evaluation, 97535 self care/ADL training, 02889 therapeutic exercise, 97530  therapeutic activity, 97140 manual therapy, 97018 paraffin, 02960 fluidotherapy, 97034 contrast bath, 97760 Orthotic Initial, [id number] Orthotic/Prosthetic subsequent, scar mobilization, passive range of motion, and patient/family education  RECOMMENDED OTHER SERVICES: none  CONSULTED AND AGREED WITH PLAN OF CARE: Patient  PLAN FOR NEXT SESSION: splint check     Ancel Peters, OTR/L,CLT 11/06/2024, 9:45 AM

## 2024-11-10 ENCOUNTER — Ambulatory Visit: Admitting: Occupational Therapy

## 2024-11-10 DIAGNOSIS — M66241 Spontaneous rupture of extensor tendons, right hand: Secondary | ICD-10-CM | POA: Diagnosis not present

## 2024-11-10 DIAGNOSIS — M66242 Spontaneous rupture of extensor tendons, left hand: Secondary | ICD-10-CM

## 2024-11-10 DIAGNOSIS — M79641 Pain in right hand: Secondary | ICD-10-CM

## 2024-11-10 NOTE — Therapy (Signed)
 OUTPATIENT OCCUPATIONAL THERAPY ORTHO TREATMENT  Patient Name: Brittany Franklin MRN: [id number] DOB:1951-03-28, 73 y.o., female Today's Date: 11/10/2024  PCP: Brittany Pepper, PA REFERRING PROVIDER: Jackquline Barrack, MD  END OF SESSION:  OT End of Session - 11/10/24 1346     Visit Number 6    Number of Visits 16    Date for Recertification  12/12/24    OT Start Time 1345    Activity Tolerance Patient tolerated treatment well    Behavior During Therapy [facility] for tasks assessed/performed          Past Medical History:  Diagnosis Date   Arthritis    Cancer (HCC)    COLON CANCER   Colitis    Colon cancer (HCC)    GERD (gastroesophageal reflux disease)    GERD without esophagitis    History of colon polyps    History of hiatal hernia    small   Hyperlipidemia    Hypertension    Neuromuscular disorder (HCC)    idiopathic neuropathy   Numbness and tingling in both hands    Osteopenia    Osteopenia of multiple sites    PONV (postoperative nausea and vomiting)    very bad nausea after colon surgery-- FENTANYL  MADE HER SICK   Rosacea    Sensory polyneuropathy    Past Surgical History:  Procedure Laterality Date   ABDOMINAL HYSTERECTOMY     BREAST BIOPSY Right 07/19/2022   Stereo right bx, Ribbon Clip - CYSTIC PAPILLARY APOCRINE METAPLASIA.   CHOLECYSTECTOMY     COLON SURGERY  2012   COLONOSCOPY WITH PROPOFOL  N/A 10/30/2017   Procedure: COLONOSCOPY WITH PROPOFOL ;  Surgeon: Brittany Gladis PENNER, MD;  Location: [facility] ENDOSCOPY;  Service: Endoscopy;  Laterality: N/A;   COLONOSCOPY WITH PROPOFOL  N/A 10/15/2020   Procedure: COLONOSCOPY WITH PROPOFOL ;  Surgeon: Brittany Ole DASEN, MD;  Location: [facility] ENDOSCOPY;  Service: Endoscopy;  Laterality: N/A;   ESOPHAGOGASTRODUODENOSCOPY (EGD) WITH PROPOFOL  N/A 10/30/2017   Procedure: ESOPHAGOGASTRODUODENOSCOPY (EGD) WITH PROPOFOL ;  Surgeon: Brittany Gladis PENNER, MD;  Location: [facility] ENDOSCOPY;  Service: Endoscopy;  Laterality: N/A;    ESOPHAGOGASTRODUODENOSCOPY (EGD) WITH PROPOFOL  N/A 10/19/2021   Procedure: ESOPHAGOGASTRODUODENOSCOPY (EGD) WITH PROPOFOL ;  Surgeon: Toledo, Ladell POUR, MD;  Location: [facility] ENDOSCOPY;  Service: Gastroenterology;  Laterality: N/A;   EYE SURGERY Bilateral    January 17and January 04, 2024   REPAIR EXTENSOR TENDON Right 09/23/2024   Procedure: REPAIR, TENDON, EXTENSOR;  Surgeon: Franklin Brittany RAMAN, MD;  Location: [facility];  Service: Orthopedics;  Laterality: Right;  Right middle finger sagittal band reconstruction; Right ring finger sagittal band reconstruction   TONSILLECTOMY     as achild   There are no active problems to display for this patient.   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  He was very happy.  Told me I can probably be doing some light weights and around 6 to 8 weeks.  I did do some gentle active range of motion taking the brace off over the weekend. Pt accompanied by: self and significant other  PERTINENT HISTORY:  Brittany Franklin is a 73 y.o. female patient who is s/p right middle and ring finger radial sagittal reconstructions  on 09/23/2024. Previously referred to OT on 08/11/24 for orthosis following nontraumatic sagittal band rupture.  10/15/24 ortho visit:  - Sutures removed in clinic today - 3 weeks total of immobilization with fingers in extension - Referral to occupational therapy for custom orthosis and to begin therapy protocol - Follow-up in 4 weeks  PRECAUTIONS: None  RED FLAGS: None   WEIGHT BEARING RESTRICTIONS: No  PAIN:  Are you having pain?no pain  FALLS: Has patient fallen in last  6 months? No  LIVING ENVIRONMENT: Lives with: lives with their spouse  PLOF:  Patient is a retired [employer] information [employer] manager.  She is very active working out-doing spin class, total body workout, working out 2 times a week with a trainer (currently focused on lower body only).   PATIENT GOALS: to return to exercise   NEXT MD VISIT: 11/07/24  OBJECTIVE:  Note: Objective measures were completed at Evaluation unless otherwise noted.  HAND DOMINANCE: Right  ADLs: Overall ADLs: buttoning clothes, cutting food  FUNCTIONAL OUTCOME MEASURES: PRHE:   UPPER EXTREMITY ROM:     Active ROM Right 10/20/24 Left eval R 10/28/24  Shoulder flexion     Shoulder abduction     Shoulder adduction     Shoulder extension     Shoulder internal rotation     Shoulder external rotation     Elbow flexion     Elbow extension     Wrist flexion 55  70  Wrist extension 65  65  Wrist ulnar deviation     Wrist radial deviation     Wrist pronation     Wrist supination     (Blank rows = not tested)  Active ROM Right 10/20/24 Left eval R 10/23/24 R 11/10/24  Thumb MCP (0-60)      Thumb IP (0-80)      Thumb Radial abd/add (0-55)       Thumb Palmar abd/add (0-45)       Thumb Opposition to Small Finger       Index MCP (0-90) 80P    70  Index PIP (0-100) 90    100  Index DIP (0-70) 70       Long MCP (0-90)      75  Long PIP (0-100) 80    95 95  Long DIP (0-70)  70      Ring MCP (0-90)      80  Ring PIP (0-100) 85    95 100  Ring DIP (0-70)  70      Little MCP (0-90) 90 P    85  Little PIP (0-100) 100P    95  Little DIP (0-70)  70      (Blank rows = not tested)   HAND FUNCTION: Grip strength: Right:   lbs; Left:   lbs, Lateral pinch: Right:   lbs, Left:   lbs, and 3 point pinch: Right:   lbs, Left:   lbs Eval not tested  COORDINATION: 9 Hole Peg test: Right:   sec; Left:   sec Eval not tested  SENSATION: WFL  EDEMA: moderate edema   COGNITION: Overall cognitive status: Within  functional limits for tasks assessed  OBSERVATIONS: bruising along dorsal aspect of hand, scar appears closed with sutures removed.    TREATMENT DATE: 11/10/24  Arrives wearing her custom hand-based yolk splint for R 3rd and 4th digits to prevent MCP flexion, to be worn during the day except when sitting resting hand in extension. Patient reports tolerating well.  Edema decreased    cont Isotoner glove to wear during the day when just sitting resting.  Active range of motion assessed for patient with out the splint.  See flowsheet. Slight pull over fourth metacarpal but no pain  Patient demo understanding for donning and doffing as well as wearing of  yolk splint. Patient is wearing it at  nighttime.   Patient can start taking splint off 3 times a day for about an hour for exercises as well as some light functional use like ADLs, folding laundry But no pulling, lifting carrying. Reviewed with patient joint protection modifications.  About 2 gripping objects 90 degrees. No twisting or lateral pinch with a loading laterally on digits or 2 hours ulnar deviation  Paraffin bath done 8 minutes To right hand prior to scar massage and range of motion to decrease stiffness and pain increase motion   Blocked MC flexion -lumbrical fist 10 reps  blocked intrinsic fist all digits 10 reps Active range of motion for flat fist to palm 10 reps All pain-free Tapping of all digits in extension of table 10 reps At radial deviation on table for all digits 8-10 reps pain-free Opposition to all digits with extension in between 5-10 reps pain-free Continue with Wrist flexion extension 10 reps   Patient to continue with Cica -Care scar pad at nighttime.  Provided new scar pad Patient to continue to wear Kinesiotape for scar mobilization during the day and a star pattern 3. OT done  for patient to wear during the day to facilitate some scar mobilization during the day with range of motion of the right hand. Provided new ones and reed again  At the end of the week patient if no increase symptoms can start taking breaks of 5 times a day for about an hour to with light gentle range of motion as well as light functional use.  Pain-free. Still wearing splint in between sessions as well as nighttime.  PATIENT EDUCATION: Education details: findings of eval and HEP  Person educated: Patient Education method: Explanation, Demonstration, Tactile cues, Verbal cues, and Handouts Education comprehension: verbalized understanding, returned demonstration, verbal cues required, and needs further education      GOALS: Goals reviewed with patient? Yes      SHORT TERM GOALS: Target date: 10/31/24   Pt will obtain protective, custom orthotic. Baseline: custom yolk splint made on eval  Goal status: MET  2.  Patient will independently don/doff and wear hand-based MCP extension splint on R third and fourth digits to avoid composite flexion and MCP flexion during activities.  Baseline: assist to don and educated on wearing schedule  Goal status: INITIAL  3.  Pt will decrease pain at worst from 2/10 to 0/10 to have better sleep and occupational participation in daily roles. Baseline: 2/10  Goal status: INITIAL  LONG TERM GOALS: Target date: 12/12/24  Pt will demo/state understanding of initial HEP to improve pain levels and prerequisite motion. Baseline: initiated at eval with MAX cueing Goal status: INITIAL  2.  Pt will improve grip strength in R hand from unable to test to at least 40 lbs for functional use at home and in IADLs. Baseline: unable to test Goal status: INITIAL  3.  R hand 3-point pinch improved from unable to test to 5  lb pain free in pain-free for patient to initiate cutting meat. Baseline: unable to test Goal status: INITIAL  4.  Pt will decrease  tenderness over scar and scar adhesions to have full range of motion at the digit flexion extension Baseline: Patient to be in [facility] block splint for 3 weeks. Goal status: INITIAL   ASSESSMENT: ASSESSMENT:  CLINICAL IMPRESSION: Patient seen for occupational therapy following right middle and ring finger radial sagittal reconstructions on 09/23/2024.  Pt is about 6 wks postop -  Patient arrives with her hand-based custom yolk splint for R 3rd and 4th digits to prevent MCP flexion, to be worn daytime  and night time patient return after seeing surgeon.  Initiate with patient 3 times a day taking splint off for about an hour performing active range of motion and can use light functional use.  At the end of the week if symptom-free can start taking breaks of 5 times a day for about an hour to 2 for light gentle flexion composite, doing home exercises and functional use.  Reviewed with patient some modifications and joint protection not loading laterally ulnarly on digits.  Maintaining 90 degree grip on cylinder objects and not sustained tight grip.  No weights or strengthening.  Continue with planks and forearms.  Still wearing splint in between sessions as well as nighttime.  Deficits noted in ROM, strength, and edema. Pt will benefit from OT services for splint fabrication, RUE ROM exercises, and pain management strategies in order to improve engagement during basic ADL/IADL tasks.     PERFORMANCE DEFICITS: in functional skills including ADLs, IADLs, ROM, strength, pain, flexibility, decreased knowledge of use of DME, and UE functional use,   and psychosocial skills including environmental adaptation and routines and behaviors.   IMPAIRMENTS: are limiting patient from ADLs, IADLs, rest and sleep, play, leisure, and social participation.   COMORBIDITIES: has no other co-morbidities that affects occupational performance. Patient will benefit from skilled OT to address above impairments and improve overall  function.  MODIFICATION OR ASSISTANCE TO COMPLETE EVALUATION: No modification of tasks or assist necessary to complete an evaluation.  OT OCCUPATIONAL PROFILE AND HISTORY: Problem focused assessment: Including review of records relating to presenting problem.  CLINICAL DECISION MAKING: LOW - limited treatment options, no task modification necessary  REHAB POTENTIAL: Good for goals  EVALUATION COMPLEXITY: Low        PLAN:  OT FREQUENCY: 1-2x/week  OT DURATION: 8 weeks  PLANNED INTERVENTIONS: 97168 OT Re-evaluation, 97535 self care/ADL training, 02889 therapeutic exercise, 97530 therapeutic activity, 97140 manual therapy, 97018 paraffin, 02960 fluidotherapy, 97034 contrast bath, 97760 Orthotic Initial, [id number] Orthotic/Prosthetic subsequent, scar mobilization, passive range of motion, and patient/family education  RECOMMENDED OTHER SERVICES: none  CONSULTED AND AGREED WITH PLAN OF CARE: Patient  PLAN FOR NEXT SESSION: splint check     Ancel Peters, OTR/L,CLT 11/10/2024, 1:46 PM

## 2024-11-20 ENCOUNTER — Ambulatory Visit: Admitting: Occupational Therapy

## 2024-11-20 DIAGNOSIS — M79641 Pain in right hand: Secondary | ICD-10-CM

## 2024-11-20 DIAGNOSIS — M66242 Spontaneous rupture of extensor tendons, left hand: Secondary | ICD-10-CM

## 2024-11-20 DIAGNOSIS — M66241 Spontaneous rupture of extensor tendons, right hand: Secondary | ICD-10-CM | POA: Diagnosis not present

## 2024-11-20 NOTE — Therapy (Signed)
 OUTPATIENT OCCUPATIONAL THERAPY ORTHO TREATMENT  Patient Name: Brittany Franklin MRN: [id number] DOB:August 11, 1951, 73 y.o., female Today's Date: 11/20/2024  PCP: Brittany Pepper, PA REFERRING PROVIDER: Jackquline Barrack, MD  END OF SESSION:  OT End of Session - 11/20/24 1041     Visit Number 7    Number of Visits 16    Date for Recertification  12/12/24    OT Start Time 1040    OT Stop Time 1120    OT Time Calculation (min) 40 min    Activity Tolerance Patient tolerated treatment well    Behavior During Therapy WFL for tasks assessed/performed          Past Medical History:  Diagnosis Date   Arthritis    Cancer (HCC)    COLON CANCER   Colitis    Colon cancer (HCC)    GERD (gastroesophageal reflux disease)    GERD without esophagitis    History of colon polyps    History of hiatal hernia    small   Hyperlipidemia    Hypertension    Neuromuscular disorder (HCC)    idiopathic neuropathy   Numbness and tingling in both hands    Osteopenia    Osteopenia of multiple sites    PONV (postoperative nausea and vomiting)    very bad nausea after colon surgery-- FENTANYL  MADE HER SICK   Rosacea    Sensory polyneuropathy    Past Surgical History:  Procedure Laterality Date   ABDOMINAL HYSTERECTOMY     BREAST BIOPSY Right 07/19/2022   Stereo right bx, Ribbon Clip - CYSTIC PAPILLARY APOCRINE METAPLASIA.   CHOLECYSTECTOMY     COLON SURGERY  2012   COLONOSCOPY WITH PROPOFOL  N/A 10/30/2017   Procedure: COLONOSCOPY WITH PROPOFOL ;  Surgeon: Brittany Gladis PENNER, MD;  Location: [facility] ENDOSCOPY;  Service: Endoscopy;  Laterality: N/A;   COLONOSCOPY WITH PROPOFOL  N/A 10/15/2020   Procedure: COLONOSCOPY WITH PROPOFOL ;  Surgeon: Brittany Ole DASEN, MD;  Location: [facility] ENDOSCOPY;  Service: Endoscopy;  Laterality: N/A;   ESOPHAGOGASTRODUODENOSCOPY (EGD) WITH PROPOFOL  N/A 10/30/2017   Procedure: ESOPHAGOGASTRODUODENOSCOPY (EGD) WITH PROPOFOL ;  Surgeon: Brittany Gladis PENNER, MD;  Location:  [facility] ENDOSCOPY;  Service: Endoscopy;  Laterality: N/A;   ESOPHAGOGASTRODUODENOSCOPY (EGD) WITH PROPOFOL  N/A 10/19/2021   Procedure: ESOPHAGOGASTRODUODENOSCOPY (EGD) WITH PROPOFOL ;  Surgeon: Toledo, Ladell POUR, MD;  Location: [facility] ENDOSCOPY;  Service: Gastroenterology;  Laterality: N/A;   EYE SURGERY Bilateral    January 17and January 04, 2024   REPAIR EXTENSOR TENDON Right 09/23/2024   Procedure: REPAIR, TENDON, EXTENSOR;  Surgeon: Franklin Brittany RAMAN, MD;  Location: [facility];  Service: Orthopedics;  Laterality: Right;  Right middle finger sagittal band reconstruction; Right ring finger sagittal band reconstruction   TONSILLECTOMY     as achild   There are no active problems to display for this patient.   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  Using it more.   Pt accompanied by: self and significant other  PERTINENT HISTORY:  Brittany Franklin is a 73 y.o. female patient who is s/p right middle and ring finger radial sagittal reconstructions on 09/23/2024. Previously referred to OT on  08/11/24 for orthosis following nontraumatic sagittal band rupture.  10/15/24 ortho visit:  - Sutures removed in clinic today - 3 weeks total of immobilization with fingers in extension - Referral to occupational therapy for custom orthosis and to begin therapy protocol - Follow-up in 4 weeks  PRECAUTIONS: None  RED FLAGS: None   WEIGHT BEARING RESTRICTIONS: No  PAIN:  Are you having pain?no pain  FALLS: Has patient fallen in last 6 months? 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  She is very active working out-doing spin class, total body workout, working out 2 times a week with a trainer (currently focused on lower body only).   PATIENT GOALS: to return to exercise   NEXT MD VISIT: 11/07/24  OBJECTIVE:  Note: Objective measures were completed at Evaluation unless otherwise noted.  HAND DOMINANCE: Right  ADLs: Overall ADLs: buttoning clothes, cutting food  FUNCTIONAL OUTCOME MEASURES: PRHE:   UPPER EXTREMITY ROM:     Active ROM Right 10/20/24 Left eval R 10/28/24  Shoulder flexion     Shoulder abduction     Shoulder adduction     Shoulder extension     Shoulder internal rotation     Shoulder external rotation     Elbow flexion     Elbow extension     Wrist flexion 55  70  Wrist extension 65  65  Wrist ulnar deviation     Wrist radial deviation     Wrist pronation     Wrist supination     (Blank rows = not tested)  Active ROM Right 10/20/24 Left eval R 10/23/24 R 11/10/24  Thumb MCP (0-60)      Thumb IP (0-80)      Thumb Radial abd/add (0-55)       Thumb Palmar abd/add (0-45)       Thumb Opposition to Small Finger       Index MCP (0-90) 80P    70  Index PIP (0-100) 90    100  Index DIP (0-70) 70       Long MCP (0-90)      75  Long PIP (0-100) 80    95 95  Long DIP (0-70)  70      Ring MCP (0-90)      80  Ring PIP (0-100) 85    95 100  Ring DIP (0-70)  70      Little MCP (0-90) 90 P    85  Little PIP (0-100) 100P    95  Little DIP (0-70)  70      (Blank rows = not tested)   HAND FUNCTION: Grip strength: Right:   lbs; Left:   lbs, Lateral pinch: Right:   lbs, Left:   lbs, and 3 point pinch: Right:   lbs, Left:   lbs Eval not tested  COORDINATION: 9 Hole Peg test: Right:   sec; Left:   sec Eval not tested  SENSATION: WFL  EDEMA: moderate edema   COGNITION: Overall cognitive status: Within functional limits for tasks  assessed  OBSERVATIONS: bruising along dorsal aspect of hand, scar appears closed with sutures removed.    TREATMENT DATE: 11/20/24  Arrives wearing her custom hand-based yolk splint for R 3rd and 4th digits to prevent MCP flexion, patient has been keeping it off for about an hour after each home exercise session using and light activities.   cont Isotoner glove t as needed  AROM patient able to make composite fist    Patient can start taking splint off -2 hours on and off during the day for about 3 days.  Then can remove it for 4 hours at a time  Using it some light functional use like ADLs, folding laundry But no pulling, lifting carrying. Reviewed with patient joint protection modifications.  About 2 gripping objects 90 degrees. No twisting or lateral pinch with a loading laterally on digits or ulnar deviation Continues to sleep with it  Paraffin bath done 8 minutes To right hand prior to scar massage and range of motion to decrease stiffness and pain increase motion   Blocked MC flexion -lumbrical fist 10 reps  blocked intrinsic fist all digits 10 reps Active range of motion for flat fist to palm 10 reps All pain-free Tapping of all digits in extension of table 10 reps Add rubber band with gravity for digit extension 2 sets of 10 reps twice a day until next week At radial deviation on table for all digits 8-10 reps pain-free At end foam block 1 cm size for adduction between 2nd and 3rd and 3rd and 4th 2 sets of 10 reps over the next few days Opposition to all digits with extension in between 5-10 reps pain-free Continue with Wrist flexion extension 10 reps   Patient to continue with Cica -Care scar pad at nighttime.   Patient to continue to wear Kinesiotape for scar mobilization during the day and a star pattern 3. OT done for patient to wear during the  day to facilitate some scar mobilization during the day with range of motion of the right hand. Provided new ones and reed again   PATIENT EDUCATION: Education details: findings of eval and HEP  Person educated: Patient Education method: Explanation, Demonstration, Tactile cues, Verbal cues, and Handouts Education comprehension: verbalized understanding, returned demonstration, verbal cues required, and needs further education      GOALS: Goals reviewed with patient? Yes      SHORT TERM GOALS: Target date: 10/31/24   Pt will obtain protective, custom orthotic. Baseline: custom yolk splint made on eval  Goal status: MET  2.  Patient will independently don/doff and wear hand-based MCP extension splint on R third and fourth digits to avoid composite flexion and MCP flexion during activities.  Baseline: assist to don and educated on wearing schedule  Goal status: INITIAL  3.  Pt will decrease pain at worst from 2/10 to 0/10 to have better sleep and occupational participation in daily roles. Baseline: 2/10  Goal status: INITIAL  LONG TERM GOALS: Target date: 12/12/24  Pt will demo/state understanding of initial HEP to improve pain levels and prerequisite motion. Baseline: initiated at eval with MAX cueing Goal status: INITIAL  2.  Pt will improve grip strength in R hand from unable to test to at least 40 lbs for functional use at home and in IADLs. Baseline: unable to test Goal status: INITIAL  3.  R hand 3-point pinch improved from unable to test to 5 lb pain free in pain-free for patient to initiate cutting meat. Baseline: unable to test Goal status: INITIAL  4.  Pt will decrease tenderness over scar and scar adhesions to have full range of  motion at the digit flexion extension Baseline: Patient to be in [facility] block splint for 3 weeks. Goal status: INITIAL   ASSESSMENT: ASSESSMENT:  CLINICAL IMPRESSION: Patient seen for occupational therapy following right middle and  ring finger radial sagittal reconstructions on 09/23/2024.  Pt is about 8 wks postop -  Patient arrives with her hand-based custom yolk splint for R 3rd and 4th digits to prevent MCP flexion-upgrade patient and add some light strengthening for digit extension with gravity as well as adduction for 2nd and 3rd 3rd and 4th small foam block.  Add to home program as well as can wean out of the brace for 2 hours on and off for 3 days.  And then for 4 hours off of the time.  But still sleeping with it.  Continue to use and light functional task.  Patient to continue with modifications and joint protection not loading laterally ulnarly on digits.  Maintaining 90 degree grip on cylinder objects and not sustained tight grip.  No weights or strengthening.  Continue with planks and forearms.   Deficits noted in ROM, strength, and edema. Pt will benefit from OT services for splint fabrication, RUE ROM exercises, and pain management strategies in order to improve engagement during basic ADL/IADL tasks.     PERFORMANCE DEFICITS: in functional skills including ADLs, IADLs, ROM, strength, pain, flexibility, decreased knowledge of use of DME, and UE functional use,   and psychosocial skills including environmental adaptation and routines and behaviors.   IMPAIRMENTS: are limiting patient from ADLs, IADLs, rest and sleep, play, leisure, and social participation.   COMORBIDITIES: has no other co-morbidities that affects occupational performance. Patient will benefit from skilled OT to address above impairments and improve overall function.  MODIFICATION OR ASSISTANCE TO COMPLETE EVALUATION: No modification of tasks or assist necessary to complete an evaluation.  OT OCCUPATIONAL PROFILE AND HISTORY: Problem focused assessment: Including review of records relating to presenting problem.  CLINICAL DECISION MAKING: LOW - limited treatment options, no task modification necessary  REHAB POTENTIAL: Good for goals  EVALUATION  COMPLEXITY: Low        PLAN:  OT FREQUENCY: 1-2x/week  OT DURATION: 8 weeks  PLANNED INTERVENTIONS: 97168 OT Re-evaluation, 97535 self care/ADL training, 02889 therapeutic exercise, 97530 therapeutic activity, 97140 manual therapy, 97018 paraffin, 02960 fluidotherapy, 97034 contrast bath, 97760 Orthotic Initial, [id number] Orthotic/Prosthetic subsequent, scar mobilization, passive range of motion, and patient/family education  RECOMMENDED OTHER SERVICES: none  CONSULTED AND AGREED WITH PLAN OF CARE: Patient  PLAN FOR NEXT SESSION: splint check     Ancel Peters, OTR/L,CLT 11/20/2024, 6:03 PM

## 2024-11-24 ENCOUNTER — Ambulatory Visit: Admitting: Occupational Therapy

## 2024-11-24 DIAGNOSIS — M79641 Pain in right hand: Secondary | ICD-10-CM

## 2024-11-24 DIAGNOSIS — M66241 Spontaneous rupture of extensor tendons, right hand: Secondary | ICD-10-CM | POA: Diagnosis not present

## 2024-11-24 NOTE — Therapy (Signed)
 " OUTPATIENT OCCUPATIONAL THERAPY ORTHO TREATMENT  Patient Name: Brittany Franklin MRN: [id number] DOB:[birth year], 73 y.o., female Today's Date: 11/24/2024  PCP: Edsel Pepper, PA REFERRING PROVIDER: Jackquline Barrack, MD  END OF SESSION:  OT End of Session - 11/24/24 1358     Visit Number 8    Number of Visits 16    Date for Recertification  12/12/24    OT Start Time 1358    OT Stop Time 1428    OT Time Calculation (min) 30 min    Activity Tolerance Patient tolerated treatment well    Behavior During Therapy WFL for tasks assessed/performed          Past Medical History:  Diagnosis Date   Arthritis    Cancer (HCC)    COLON CANCER   Colitis    Colon cancer (HCC)    GERD (gastroesophageal reflux disease)    GERD without esophagitis    History of colon polyps    History of hiatal hernia    small   Hyperlipidemia    Hypertension    Neuromuscular disorder (HCC)    idiopathic neuropathy   Numbness and tingling in both hands    Osteopenia    Osteopenia of multiple sites    PONV (postoperative nausea and vomiting)    very bad nausea after colon surgery-- FENTANYL  MADE HER SICK   Rosacea    Sensory polyneuropathy    Past Surgical History:  Procedure Laterality Date   ABDOMINAL HYSTERECTOMY     BREAST BIOPSY Right 07/19/2022   Stereo right bx, Ribbon Clip - CYSTIC PAPILLARY APOCRINE METAPLASIA.   CHOLECYSTECTOMY     COLON SURGERY  2012   COLONOSCOPY WITH PROPOFOL  N/A 10/30/2017   Procedure: COLONOSCOPY WITH PROPOFOL ;  Surgeon: Gaylyn Gladis PENNER, MD;  Location: [facility] ENDOSCOPY;  Service: Endoscopy;  Laterality: N/A;   COLONOSCOPY WITH PROPOFOL  N/A 10/15/2020   Procedure: COLONOSCOPY WITH PROPOFOL ;  Surgeon: Maryruth Ole DASEN, MD;  Location: [facility] ENDOSCOPY;  Service: Endoscopy;  Laterality: N/A;   ESOPHAGOGASTRODUODENOSCOPY (EGD) WITH PROPOFOL  N/A 10/30/2017   Procedure: ESOPHAGOGASTRODUODENOSCOPY (EGD) WITH PROPOFOL ;  Surgeon: Gaylyn Gladis PENNER, MD;  Location:  [facility] ENDOSCOPY;  Service: Endoscopy;  Laterality: N/A;   ESOPHAGOGASTRODUODENOSCOPY (EGD) WITH PROPOFOL  N/A 10/19/2021   Procedure: ESOPHAGOGASTRODUODENOSCOPY (EGD) WITH PROPOFOL ;  Surgeon: Toledo, Ladell POUR, MD;  Location: [facility] ENDOSCOPY;  Service: Gastroenterology;  Laterality: N/A;   EYE SURGERY Bilateral    January 17and January 04, 2024   REPAIR EXTENSOR TENDON Right 09/23/2024   Procedure: REPAIR, TENDON, EXTENSOR;  Surgeon: Barrack Jackquline RAMAN, MD;  Location: [facility];  Service: Orthopedics;  Laterality: Right;  Right middle finger sagittal band reconstruction; Right ring finger sagittal band reconstruction   TONSILLECTOMY     as achild   There are no active problems to display for this patient.   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 female patient who is s/p right middle and ring finger radial sagittal reconstructions on 09/23/2024. Previously referred to OT on 08/11/24 for orthosis  following nontraumatic sagittal band rupture.  10/15/24 ortho visit:  - Sutures removed in clinic today - 3 weeks total of immobilization with fingers in extension - Referral to occupational therapy for custom orthosis and to begin therapy protocol - Follow-up in 4 weeks  PRECAUTIONS: None  RED FLAGS: None   WEIGHT BEARING RESTRICTIONS: No  PAIN:  Are you having pain?no pain  FALLS: Has patient fallen in last 6 months? No  LIVING ENVIRONMENT: Lives with: lives with their spouse  PLOF:  Patient is a  retired tourist information centre manager.  She is very active working out-doing spin class, total body workout, working out 2 times a week with a trainer (currently focused on lower body only).   PATIENT GOALS: to return to exercise   NEXT MD VISIT: 11/07/24  OBJECTIVE:  Note: Objective measures were completed at Evaluation unless otherwise noted.  HAND DOMINANCE: Right  ADLs: Overall ADLs: buttoning clothes, cutting food  FUNCTIONAL OUTCOME MEASURES: PRHE:   UPPER EXTREMITY ROM:     Active ROM Right 10/20/24 Left eval R 10/28/24  Shoulder flexion     Shoulder abduction     Shoulder adduction     Shoulder extension     Shoulder internal rotation     Shoulder external rotation     Elbow flexion     Elbow extension     Wrist flexion 55  70  Wrist extension 65  65  Wrist ulnar deviation     Wrist radial deviation     Wrist pronation     Wrist supination     (Blank rows = not tested)  Active ROM Right 10/20/24 Left eval R 10/23/24 R 11/10/24  Thumb MCP (0-60)      Thumb IP (0-80)      Thumb Radial abd/add (0-55)       Thumb Palmar abd/add (0-45)       Thumb Opposition to Small Finger       Index MCP (0-90) 80P    70  Index PIP (0-100) 90    100  Index DIP (0-70) 70       Long MCP (0-90)      75  Long PIP (0-100) 80    95 95  Long DIP (0-70)  70      Ring MCP (0-90)      80  Ring PIP (0-100) 85    95 100  Ring DIP (0-70)  70      Little MCP (0-90) 90 P    85  Little PIP (0-100) 100P    95  Little DIP (0-70)  70      (Blank rows = not tested)   HAND FUNCTION: 11/24/24 Grip strength: Right: 41 lbs; Left: 54 lbs, Lateral pinch: Right: 8 lbs, Left: 18 lbs, and 3 point pinch: Right: 10 lbs, Left: 12 lbs   COORDINATION: 9 Hole Peg test: Right:   sec; Left:   sec Eval not tested  SENSATION: WFL  EDEMA: moderate edema   COGNITION: Overall cognitive status: Within functional limits for tasks assessed  OBSERVATIONS: bruising along dorsal aspect of hand, scar  appears closed with sutures removed.    TREATMENT DATE: 11/24/24  Arrives wearing her custom hand-based yolk splint for R 3rd and 4th digits to prevent MCP flexion,had it off most all the time during day -except heavy act and night time  Pt can now discontinue wearing - can put on if she notice ext lag   cont Isotoner glove t as needed  AROM patient able to make composite fist Recommend for her to cont to use some heat to decrease stiffness  Can done tendon glides after heat in am and prior to workouts  Tapping of all digits in extension of table 10 reps Cont rubber band with gravity for digits extension 2 sets of 10 reps twice a day until next week Foam block 1 cm size for adduction between 2nd and 3rd and 3rd and 4th 2 sets of 10 reps over the next few days Opposition to all digits with extension in between 5-10 reps pain-free Add and upgrade wrist and forearm 15 reps - 1 lbs weight  Increase to 2nd set in 2 day and then 3 sets this weekend   Next week can increase to 2 lbs - same progressing if symptoms free  And can do elbow and shoulder exercises with 2 lbs  But open hand or ext digits inbetween sets  Monitor for ext lag on digits       Reviewed with patient joint protection modifications.  About 2 gripping objects 90 degrees. No twisting or lateral pinch with a loading laterally on digits or ulnar deviation  Patient to continue with Cica -Care scar pad at nighttime.   Patient to continue to wear Kinesiotape for scar mobilization during the day and a star pattern 3.    PATIENT EDUCATION: Education details: findings of eval and HEP  Person educated: Patient Education method: Explanation, Demonstration, Tactile cues, Verbal cues, and Handouts Education comprehension: verbalized understanding, returned demonstration, verbal cues required, and needs  further education      GOALS: Goals reviewed with patient? Yes      SHORT TERM GOALS: Target date: 10/31/24   Pt will obtain protective, custom orthotic. Baseline: custom yolk splint made on eval  Goal status: MET  2.  Patient will independently don/doff and wear hand-based MCP extension splint on R third and fourth digits to avoid composite flexion and MCP flexion during activities.  Baseline: assist to don and educated on wearing schedule  Goal status: INITIAL  3.  Pt will decrease pain at worst from 2/10 to 0/10 to have better sleep and occupational participation in daily roles. Baseline: 2/10  Goal status: INITIAL  LONG TERM GOALS: Target date: 12/12/24  Pt will demo/state understanding of initial HEP to improve pain levels and prerequisite motion. Baseline: initiated at eval with MAX cueing Goal status: INITIAL  2.  Pt will improve grip strength in R hand from unable to test to at least 40 lbs for functional use at home and in IADLs. Baseline: unable to test Goal status: INITIAL  3.  R hand 3-point pinch improved from unable to test to 5 lb pain free in pain-free for patient to initiate cutting meat. Baseline: unable to test Goal status: INITIAL  4.  Pt will decrease tenderness over scar and scar adhesions to have full range of motion at the digit flexion extension Baseline: Patient to be in [facility] block splint for 3 weeks. Goal status: INITIAL   ASSESSMENT: ASSESSMENT:  CLINICAL IMPRESSION: Patient seen for occupational therapy following right middle and ring finger radial sagittal reconstructions on 09/23/2024.  NOW pt can discontinue  splint wearing - monitor for ext lag - if - then wear some -but other wise cont with digits extention strengthening - digits adduction for 2nd and 3rd 3rd and 4th small foam block.  -start with 1 lbs for wrist and forearm - increase reps and sets this week - next week if symptom free - increase to 2 lbs and same progression- cont  increase functional use symptoms free-  pt has knowledge on joint protection and not to over grip - pt will benefit from OT services for splint fabrication, RUE ROM exercises, and pain management strategies in order to improve engagement during basic ADL/IADL tasks.     PERFORMANCE DEFICITS: in functional skills including ADLs, IADLs, ROM, strength, pain, flexibility, decreased knowledge of use of DME, and UE functional use,   and psychosocial skills including environmental adaptation and routines and behaviors.   IMPAIRMENTS: are limiting patient from ADLs, IADLs, rest and sleep, play, leisure, and social participation.   COMORBIDITIES: has no other co-morbidities that affects occupational performance. Patient will benefit from skilled OT to address above impairments and improve overall function.  MODIFICATION OR ASSISTANCE TO COMPLETE EVALUATION: No modification of tasks or assist necessary to complete an evaluation.  OT OCCUPATIONAL PROFILE AND HISTORY: Problem focused assessment: Including review of records relating to presenting problem.  CLINICAL DECISION MAKING: LOW - limited treatment options, no task modification necessary  REHAB POTENTIAL: Good for goals  EVALUATION COMPLEXITY: Low        PLAN:  OT FREQUENCY: 1-2x/week  OT DURATION: 8 weeks  PLANNED INTERVENTIONS: 97168 OT Re-evaluation, 97535 self care/ADL training, 02889 therapeutic exercise, 97530 therapeutic activity, 97140 manual therapy, 97018 paraffin, 02960 fluidotherapy, 97034 contrast bath, 97760 Orthotic Initial, [id number] Orthotic/Prosthetic subsequent, scar mobilization, passive range of motion, and patient/family education  RECOMMENDED OTHER SERVICES: none  CONSULTED AND AGREED WITH PLAN OF CARE: Patient  PLAN FOR NEXT SESSION: splint check     Ancel Peters, OTR/L,CLT 11/24/2024, 2:28 PM   "

## 2024-12-08 ENCOUNTER — Ambulatory Visit: Admitting: Occupational Therapy

## 2024-12-08 DIAGNOSIS — M66241 Spontaneous rupture of extensor tendons, right hand: Secondary | ICD-10-CM | POA: Diagnosis not present

## 2024-12-08 DIAGNOSIS — M79642 Pain in left hand: Secondary | ICD-10-CM | POA: Diagnosis present

## 2024-12-08 DIAGNOSIS — M66242 Spontaneous rupture of extensor tendons, left hand: Secondary | ICD-10-CM | POA: Insufficient documentation

## 2024-12-08 DIAGNOSIS — M79641 Pain in right hand: Secondary | ICD-10-CM | POA: Diagnosis present

## 2024-12-08 NOTE — Therapy (Signed)
 " OUTPATIENT OCCUPATIONAL THERAPY ORTHO TREATMENT  Patient Name: Brittany Franklin MRN: [id number] DOB:1951-03-31, 74 y.o., female Today's Date: 12/08/2024  PCP: Edsel Pepper, PA REFERRING PROVIDER: Jackquline Barrack, MD  END OF SESSION:  OT End of Session - 12/08/24 1035     Visit Number 9    Number of Visits 16    Date for Recertification  12/12/24    OT Start Time 1032    OT Stop Time 1111    OT Time Calculation (min) 39 min    Activity Tolerance Patient tolerated treatment well    Behavior During Therapy WFL for tasks assessed/performed          Past Medical History:  Diagnosis Date   Arthritis    Cancer (HCC)    COLON CANCER   Colitis    Colon cancer (HCC)    GERD (gastroesophageal reflux disease)    GERD without esophagitis    History of colon polyps    History of hiatal hernia    small   Hyperlipidemia    Hypertension    Neuromuscular disorder (HCC)    idiopathic neuropathy   Numbness and tingling in both hands    Osteopenia    Osteopenia of multiple sites    PONV (postoperative nausea and vomiting)    very bad nausea after colon surgery-- FENTANYL  MADE HER SICK   Rosacea    Sensory polyneuropathy    Past Surgical History:  Procedure Laterality Date   ABDOMINAL HYSTERECTOMY     BREAST BIOPSY Right 07/19/2022   Stereo right bx, Ribbon Clip - CYSTIC PAPILLARY APOCRINE METAPLASIA.   CHOLECYSTECTOMY     COLON SURGERY  2012   COLONOSCOPY WITH PROPOFOL  N/A 10/30/2017   Procedure: COLONOSCOPY WITH PROPOFOL ;  Surgeon: Gaylyn Gladis PENNER, MD;  Location: [facility] ENDOSCOPY;  Service: Endoscopy;  Laterality: N/A;   COLONOSCOPY WITH PROPOFOL  N/A 10/15/2020   Procedure: COLONOSCOPY WITH PROPOFOL ;  Surgeon: Maryruth Ole DASEN, MD;  Location: [facility] ENDOSCOPY;  Service: Endoscopy;  Laterality: N/A;   ESOPHAGOGASTRODUODENOSCOPY (EGD) WITH PROPOFOL  N/A 10/30/2017   Procedure: ESOPHAGOGASTRODUODENOSCOPY (EGD) WITH PROPOFOL ;  Surgeon: Gaylyn Gladis PENNER, MD;  Location: [facility]  ENDOSCOPY;  Service: Endoscopy;  Laterality: N/A;   ESOPHAGOGASTRODUODENOSCOPY (EGD) WITH PROPOFOL  N/A 10/19/2021   Procedure: ESOPHAGOGASTRODUODENOSCOPY (EGD) WITH PROPOFOL ;  Surgeon: Toledo, Ladell POUR, MD;  Location: [facility] ENDOSCOPY;  Service: Gastroenterology;  Laterality: N/A;   EYE SURGERY Bilateral    January 17and January 04, 2024   REPAIR EXTENSOR TENDON Right 09/23/2024   Procedure: REPAIR, TENDON, EXTENSOR;  Surgeon: Barrack Jackquline RAMAN, MD;  Location: [facility];  Service: Orthopedics;  Laterality: Right;  Right middle finger sagittal band reconstruction; Right ring finger sagittal band reconstruction   TONSILLECTOMY     as achild   There are no active problems to display for this patient.   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  Not wearing splint.  I have been using with my trainer 2 pound weights with no issues. Pt accompanied by: self and significant other  PERTINENT HISTORY:  Brittany Franklin is a 74 y.o. female patient who is s/p right middle and ring finger radial sagittal reconstructions on 09/23/2024. Previously referred to OT on 08/11/24 for orthosis following nontraumatic sagittal band  rupture.  10/15/24 ortho visit:  - Sutures removed in clinic today - 3 weeks total of immobilization with fingers in extension - Referral to occupational therapy for custom orthosis and to begin therapy protocol - Follow-up in 4 weeks  PRECAUTIONS: None  RED FLAGS: None   WEIGHT BEARING RESTRICTIONS: No  PAIN:  Are you having pain?no pain  FALLS: Has patient fallen in last 6 months? No  LIVING ENVIRONMENT: Lives with: lives with their spouse  PLOF:  Patient is a retired  tourist information centre manager.  She is very active working out-doing spin class, total body workout, working out 2 times a week with a trainer (currently focused on lower body only).   PATIENT GOALS: to return to exercise   NEXT MD VISIT: 11/07/24  OBJECTIVE:  Note: Objective measures were completed at Evaluation unless otherwise noted.  HAND DOMINANCE: Right  ADLs: Overall ADLs: buttoning clothes, cutting food  FUNCTIONAL OUTCOME MEASURES: PRHE:   UPPER EXTREMITY ROM:     Active ROM Right 10/20/24 Left eval R 10/28/24  Shoulder flexion     Shoulder abduction     Shoulder adduction     Shoulder extension     Shoulder internal rotation     Shoulder external rotation     Elbow flexion     Elbow extension     Wrist flexion 55  70  Wrist extension 65  65  Wrist ulnar deviation     Wrist radial deviation     Wrist pronation     Wrist supination     (Blank rows = not tested)  Active ROM Right 10/20/24 Left eval R 10/23/24 R 11/10/24 R 12/08/24  Thumb MCP (0-60)       Thumb IP (0-80)       Thumb Radial abd/add (0-55)        Thumb Palmar abd/add (0-45)        Thumb Opposition to Small Finger        Index MCP (0-90) 80P    70 90  Index PIP (0-100) 90    100 100  Index DIP (0-70) 70        Long MCP (0-90)      75 90  Long PIP (0-100) 80    95 95 95  Long DIP (0-70)  70       Ring MCP (0-90)      80 90  Ring PIP (0-100) 85    95 100 95  Ring DIP (0-70)  70       Little MCP (0-90) 90 P    85 90  Little PIP (0-100) 100P    95 95  Little DIP (0-70)  70       (Blank rows = not tested)   HAND FUNCTION: 11/24/24 Grip strength: Right: 41 lbs; Left: 54 lbs, Lateral pinch: Right: 8 lbs, Left: 18 lbs, and 3 point pinch: Right: 10 lbs, Left: 12 lbs 12/08/24 Grip strength: Right: 51 lbs; Left: 54 lbs, Lateral pinch: Right: 11 lbs, Left: 18 lbs, and 3 point pinch: Right: 10 lbs, Left: 12 lbs  COORDINATION: 12/08/24 Simulated fingertips<> palm pick up and retrieve for objects Left  hand 13.91 seconds and left 11.76 seconds SENSATION: WFL  EDEMA: moderate edema   COGNITION: Overall cognitive status: Within functional limits for tasks assessed  OBSERVATIONS: bruising along dorsal aspect of hand, scar appears closed with sutures removed.    TREATMENT DATE:  12/08/24  Patient arrived with reports of not had to wear brace at all or splint since last seen 2 weeks ago. Flexion of digits within normal range While maintaining digit alignment and extension and deviation Fine motor coordination SS within normal range Grip and lateral pinch improved.  See flowsheet 3-point pinch continues to be right 10 pounds left 12 pounds All above symptom-free  cont Isotoner glove tas needed  Patient has been upgraded to 2 pounds last week with her trainer. Assessed patient today with 3 pound for supination pronation and radial ulnar deviation.  Tolerated well As well as wrist extension flexion All 12-15 reps symptom-free Patient can continues to gradually increase her weight using dumbbells with her trainer. Symptom-free.  Simulate with patient coming in self-care activities and IADLs. Able to turn doorknob, push and pull heavy door.  Simulate half a gallon and 1 gallon carrying and pouring a drink. No issues patient can simulate pouring 4 to 6 pounds. Not recommended to do [employer].  But was able to pick up and carry symptom free  Reviewed with patient rubber band for digit extension to perform 3rd through 5th digit extension like 20 reps 3 times a day for another few weeks.   And can do elbow and shoulder exercises with 2 lbs  But open hand or ext digits inbetween sets  Monitor for ext lag on digits       R did review and reinforce with her just some modification and joint protection.    PATIENT EDUCATION: Education details: Progress and discharge  instructions and HEP  Person educated: Patient Education method: Explanation, Demonstration, Tactile cues, Verbal cues, and Handouts Education comprehension: verbalized understanding, returned demonstration, verbal cues required, and needs further education      GOALS: Goals reviewed with patient? Yes      SHORT TERM GOALS: Target date: 10/31/24   Pt will obtain protective, custom orthotic. Baseline: custom yolk splint made on eval  Goal status: MET  2.  Patient will independently don/doff and wear hand-based MCP extension splint on R third and fourth digits to avoid composite flexion and MCP flexion during activities.  Baseline: assist to don and educated on wearing schedule  Goal status: Met  3.  Pt will decrease pain at worst from 2/10 to 0/10 to have better sleep and occupational participation in daily roles. Baseline: 2/10  Goal status: Met  LONG TERM GOALS: Target date: 12/12/24  Pt will demo/state understanding of initial HEP to improve pain levels and prerequisite motion. Baseline: initiated at eval with MAX cueing Goal status: Met  2.  Pt will improve grip strength in R hand from unable to test to at least 40 lbs for functional use at home and in IADLs. Baseline: unable to test Goal status: Met  3.  R hand 3-point pinch improved from unable to test to 5 lb pain free in pain-free for patient to initiate cutting meat. Baseline: unable to test Goal status: Met  4.  Pt will decrease tenderness over scar and scar adhesions to have full range of motion at the digit flexion extension Baseline: Patient to be in [facility] block splint for 3 weeks. Goal status: Met   ASSESSMENT: ASSESSMENT:  CLINICAL IMPRESSION: Patient seen for occupational therapy following right middle and ring finger radial sagittal reconstructions on 09/23/2024.  NOW patient performed home exercises for 2 weeks over the holidays.  Patient reports not wearing her splint at all.  Maintaining extension as  well as flexion of digits.  As well  as alignment.  Patient was able to upgrade to performing 2 pound weights with her trainer.  In clinic today patient was able to do 3 pounds for wrist and forearm symptom-free.  Recommend for her to gradually increase her strength with her trainer symptom-free.  Her grip) shin strength improved see flowsheets.  Patient able to functionally use her right hand in ADLs and IADLs.  Including pushing and pulling heavy door and doorknob symptom-free.  I did recommend for her to continue with digit extension strengthening for a few more weeks.  Patient met all goals and is this time discharged from OT services. SABRA     PERFORMANCE DEFICITS: in functional skills including ADLs, IADLs, ROM, strength, pain, flexibility, decreased knowledge of use of DME, and UE functional use,   and psychosocial skills including environmental adaptation and routines and behaviors.   IMPAIRMENTS: are limiting patient from ADLs, IADLs, rest and sleep, play, leisure, and social participation.   COMORBIDITIES: has no other co-morbidities that affects occupational performance. Patient will benefit from skilled OT to address above impairments and improve overall function.  MODIFICATION OR ASSISTANCE TO COMPLETE EVALUATION: No modification of tasks or assist necessary to complete an evaluation.  OT OCCUPATIONAL PROFILE AND HISTORY: Problem focused assessment: Including review of records relating to presenting problem.  CLINICAL DECISION MAKING: LOW - limited treatment options, no task modification necessary  REHAB POTENTIAL: Good for goals  EVALUATION COMPLEXITY: Low        PLAN:  OT FREQUENCY: 1-2x/week  OT DURATION: 8 weeks  PLANNED INTERVENTIONS: 97168 OT Re-evaluation, 97535 self care/ADL training, 02889 therapeutic exercise, 97530 therapeutic activity, 97140 manual therapy, 97018 paraffin, 02960 fluidotherapy, 97034 contrast bath, 97760 Orthotic Initial, [id number] Orthotic/Prosthetic  subsequent, scar mobilization, passive range of motion, and patient/family education  RECOMMENDED OTHER SERVICES: none  CONSULTED AND AGREED WITH PLAN OF CARE: Patient  PLAN FOR NEXT SESSION: splint check     Ancel Peters, OTR/L,CLT 12/08/2024, 11:56 AM   "

## 2024-12-15 ENCOUNTER — Ambulatory Visit: Admitting: Occupational Therapy
